# Patient Record
Sex: Male | Born: 1960 | Race: Black or African American | Hispanic: No | Marital: Married | Smoking: Never smoker
Health system: Southern US, Community
[De-identification: ages and names within clinical notes are randomized; demographics above are authoritative.]

## PROBLEM LIST (undated history)

## (undated) DIAGNOSIS — R002 Palpitations: Secondary | ICD-10-CM

## (undated) DIAGNOSIS — T7840XA Allergy, unspecified, initial encounter: Secondary | ICD-10-CM

## (undated) HISTORY — DX: Allergy, unspecified, initial encounter: T78.40XA

## (undated) HISTORY — DX: Palpitations: R00.2

## (undated) HISTORY — PX: WISDOM TOOTH EXTRACTION: SHX21

---

## 1999-09-02 ENCOUNTER — Emergency Department (HOSPITAL_COMMUNITY): Admission: EM | Admit: 1999-09-02 | Discharge: 1999-09-02 | Payer: Self-pay | Admitting: Emergency Medicine

## 1999-09-02 ENCOUNTER — Encounter: Payer: Self-pay | Admitting: Emergency Medicine

## 2007-11-28 HISTORY — PX: HAND SURGERY: SHX662

## 2008-04-24 ENCOUNTER — Emergency Department (HOSPITAL_COMMUNITY): Admission: EM | Admit: 2008-04-24 | Discharge: 2008-04-24 | Payer: Self-pay | Admitting: Family Medicine

## 2008-06-08 ENCOUNTER — Emergency Department (HOSPITAL_COMMUNITY): Admission: EM | Admit: 2008-06-08 | Discharge: 2008-06-08 | Payer: Self-pay | Admitting: Emergency Medicine

## 2009-09-20 ENCOUNTER — Emergency Department (HOSPITAL_COMMUNITY): Admission: EM | Admit: 2009-09-20 | Discharge: 2009-09-20 | Payer: Self-pay | Admitting: Emergency Medicine

## 2012-09-27 ENCOUNTER — Encounter (HOSPITAL_COMMUNITY): Payer: Self-pay

## 2012-09-27 ENCOUNTER — Emergency Department (INDEPENDENT_AMBULATORY_CARE_PROVIDER_SITE_OTHER): Payer: 59

## 2012-09-27 ENCOUNTER — Emergency Department (INDEPENDENT_AMBULATORY_CARE_PROVIDER_SITE_OTHER)
Admission: EM | Admit: 2012-09-27 | Discharge: 2012-09-27 | Disposition: A | Discharge status: Home / Self Care | Payer: 59 | Source: Home / Self Care | Attending: Emergency Medicine | Admitting: Emergency Medicine

## 2012-09-27 DIAGNOSIS — N419 Inflammatory disease of prostate, unspecified: Secondary | ICD-10-CM

## 2012-09-27 DIAGNOSIS — D649 Anemia, unspecified: Secondary | ICD-10-CM

## 2012-09-27 LAB — COMPREHENSIVE METABOLIC PANEL
ALT: 20 U/L (ref 0–53)
AST: 26 U/L (ref 0–37)
Albumin: 4 g/dL (ref 3.5–5.2)
Alkaline Phosphatase: 88 U/L (ref 39–117)
BUN: 17 mg/dL (ref 6–23)
CO2: 26 mEq/L (ref 19–32)
Calcium: 9.2 mg/dL (ref 8.4–10.5)
Chloride: 103 mEq/L (ref 96–112)
Creatinine, Ser: 1.31 mg/dL (ref 0.50–1.35)
GFR calc Af Amer: 72 mL/min — ABNORMAL LOW (ref >90)
GFR calc non Af Amer: 62 mL/min — ABNORMAL LOW (ref >90)
Glucose, Bld: 97 mg/dL (ref 70–99)
Potassium: 3.5 mEq/L (ref 3.5–5.1)
Sodium: 137 mEq/L (ref 135–145)
Total Bilirubin: 0.5 mg/dL (ref 0.3–1.2)
Total Protein: 7.2 g/dL (ref 6.0–8.3)

## 2012-09-27 LAB — CBC WITH DIFFERENTIAL/PLATELET
Basophils Absolute: 0 10*3/uL (ref 0.0–0.1)
Basophils Relative: 0 % (ref 0–1)
Eosinophils Absolute: 0 10*3/uL (ref 0.0–0.7)
Eosinophils Relative: 0 % (ref 0–5)
HCT: 38.6 % — ABNORMAL LOW (ref 39.0–52.0)
Hemoglobin: 12.4 g/dL — ABNORMAL LOW (ref 13.0–17.0)
Lymphocytes Relative: 4 % — ABNORMAL LOW (ref 12–46)
Lymphs Abs: 0.6 10*3/uL — ABNORMAL LOW (ref 0.7–4.0)
MCH: 24.2 pg — ABNORMAL LOW (ref 26.0–34.0)
MCHC: 32.1 g/dL (ref 30.0–36.0)
MCV: 75.4 fL — ABNORMAL LOW (ref 78.0–100.0)
Monocytes Absolute: 1.1 10*3/uL — ABNORMAL HIGH (ref 0.1–1.0)
Monocytes Relative: 9 % (ref 3–12)
Neutro Abs: 11.3 10*3/uL — ABNORMAL HIGH (ref 1.7–7.7)
Neutrophils Relative %: 87 % — ABNORMAL HIGH (ref 43–77)
Platelets: 193 10*3/uL (ref 150–400)
RBC: 5.12 MIL/uL (ref 4.22–5.81)
RDW: 15.7 % — ABNORMAL HIGH (ref 11.5–15.5)
WBC: 13 10*3/uL — ABNORMAL HIGH (ref 4.0–10.5)

## 2012-09-27 LAB — POCT URINALYSIS DIP (DEVICE)
Bilirubin Urine: NEGATIVE
Ketones, ur: NEGATIVE mg/dL
Leukocytes, UA: NEGATIVE
Protein, ur: 30 mg/dL — AB

## 2012-09-27 MED ORDER — ACETAMINOPHEN 325 MG PO TABS
ORAL_TABLET | ORAL | Status: AC
  Filled 2012-09-27: qty 3

## 2012-09-27 MED ORDER — ACETAMINOPHEN 500 MG PO TABS
1000.0000 mg | ORAL_TABLET | Freq: Once | ORAL | Status: AC
  Administered 2012-09-27: 1000 mg via ORAL

## 2012-09-27 MED ORDER — CIPROFLOXACIN HCL 500 MG PO TABS: 500.0000 mg | ORAL_TABLET | Freq: Two times a day (BID) | ORAL | Status: DC

## 2012-09-27 NOTE — ED Notes (Signed)
Fever and chills since 12 noon, no medication for this problem

## 2012-09-27 NOTE — ED Provider Notes (Signed)
Chief Complaint  Patient presents with  . 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 He did not measure his temperature. He denies any headache, nasal congestion, rhinorrhea, sore throat, pain on swallowing, or oral lesions. He has no cervical adenopathy or adenopathy anywhere else. He denies any coughing, wheezing, or shortness of breath. No chest pain. He denies any nausea, vomiting, or diarrhea. He denies any dysuria, urgency, hematuria, or decrease in his stream. He has not had any skin rash or history of a tick bite. He denies any joint pains other than a slight pain in his left elbow which is located over the lateral epicondyle, without any swelling of the elbow joint. He denies any foreign travel or travel to any other parts of the [place]. He's not been exposed to any exotic animals and he denies any drinking of unpasteurized milk or water that had not been treated. 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 He has made a couple of emergency calls on sick people at home, but states these have mostly been cardiac issues and not infectious. He denies any prior history of prostatitis or urinary tract infections.  Review of Systems:  Other than noted above, the patient denies any of the following symptoms. Systemic:  No chills, sweats, fatigue, myalgias, headache, or anorexia. Eye:  No redness, pain or drainage. ENT:  No earache, nasal congestion, rhinorrhea, sinus pressure, or sore throat. No adenopathy or stiff neck. Lungs:  No cough, sputum production, wheezing, shortness of breath.  Cardiovascular:  No chest pain, palpitations, or syncope. GI:  No  nausea, vomiting, abdominal pain or diarrhea. GU:  No dysuria, frequency, or hematuria. Skin:  No rash or pruritis.  PMFSH:  Past medical history, family history, social history, meds, and allergies were reviewed. There is no history of recent foreign travel, animal exposure, suspicious ingestions or tick bite.  No new medications, vaccination, or bites or stings.  Physical Exam:   Vital signs:  BP 112/55  Pulse 81  Temp 100.2 F (37.9 C) (Oral)  Resp 14  SpO2 100% General:  Alert, in no distress. Eye:  PERRL, full EOMs.  Lids and conjunctivas were normal. ENT:  TMs and canals were normal, without erythema or inflammation.  Nasal mucosa was clear and uncongested, without drainage.  Mucous membranes were moist.  Pharynx was clear, without exudate or drainage.  There were no oral ulcerations or lesions. Neck:  Supple, no adenopathy, tenderness or mass. Thyroid was normal. Lungs:  No respiratory distress.  Lungs were clear to auscultation, without wheezes, rales or rhonchi.  Breath sounds were clear and equal bilaterally. Heart:  Regular rhythm, without gallops, murmers or rubs. Abdomen:  Soft, flat, and non-tender to palpation.  No hepatosplenomagaly or mass. Rectal exam: Digital rectal reveals no masses, no tenderness, normal prostate exam, no prostatic tenderness or masses, and heme-negative stool. Extremities:  No swelling, erythema, or joint pain to palpation. Skin:  Clear, warm, and dry, without rash or lesions.  Labs:   Results for orders placed during the hospital encounter of 09/27/12  CBC WITH DIFFERENTIAL      Component Value Range   WBC  13.0 (*) 4.0 - 10.5 K/uL   RBC 5.12  4.22 - 5.81 MIL/uL   Hemoglobin 12.4 (*) 13.0 - 17.0 g/dL   HCT 16.1 (*) 09.6 - 04.5 %   MCV 75.4 (*) 78.0 - 100.0 fL   MCH 24.2 (*) 26.0 - 34.0 pg   MCHC 32.1  30.0 - 36.0 g/dL   RDW 40.9 (*) 81.1 - 91.4 %   Platelets 193  150 - 400 K/uL   Neutrophils Relative 87 (*) 43 - 77 %   Neutro Abs 11.3 (*)  1.7 - 7.7 K/uL   Lymphocytes Relative 4 (*) 12 - 46 %   Lymphs Abs 0.6 (*) 0.7 - 4.0 K/uL   Monocytes Relative 9  3 - 12 %   Monocytes Absolute 1.1 (*) 0.1 - 1.0 K/uL   Eosinophils Relative 0  0 - 5 %   Eosinophils Absolute 0.0  0.0 - 0.7 K/uL   Basophils Relative 0  0 - 1 %   Basophils Absolute 0.0  0.0 - 0.1 K/uL  COMPREHENSIVE METABOLIC PANEL      Component Value Range   Sodium 137  135 - 145 mEq/L   Potassium 3.5  3.5 - 5.1 mEq/L   Chloride 103  96 - 112 mEq/L   CO2 26  19 - 32 mEq/L   Glucose, Bld 97  70 - 99 mg/dL   BUN 17  6 - 23 mg/dL   Creatinine, Ser 7.82  0.50 - 1.35 mg/dL   Calcium 9.2  8.4 - 95.6 mg/dL   Total Protein 7.2  6.0 - 8.3 g/dL   Albumin 4.0  3.5 - 5.2 g/dL   AST 26  0 - 37 U/L   ALT 20  0 - 53 U/L   Alkaline Phosphatase 88  39 - 117 U/L   Total Bilirubin 0.5  0.3 - 1.2 mg/dL   GFR calc non Af Amer 62 (*) >90 mL/min   GFR calc Af Amer 72 (*) >90 mL/min  POCT URINALYSIS DIP (DEVICE)      Component Value Range   Glucose, UA NEGATIVE  NEGATIVE mg/dL   Bilirubin Urine NEGATIVE  NEGATIVE   Ketones, ur NEGATIVE  NEGATIVE mg/dL   Specific Gravity, Urine 1.015  1.005 - 1.030   Hgb urine dipstick NEGATIVE  NEGATIVE   pH 7.0  5.0 - 8.0   Protein, ur 30 (*) NEGATIVE mg/dL   Urobilinogen, UA 0.2  0.0 - 1.0 mg/dL   Nitrite NEGATIVE  NEGATIVE   Leukocytes, UA NEGATIVE  NEGATIVE     Radiology:  Dg Chest 2 View  09/27/2012  *RADIOLOGY REPORT*  Clinical Data: Fever.  CHEST - 2 VIEW  Comparison: None.  Findings: Cardiomediastinal silhouette appears normal.  No acute pulmonary disease is noted.  Bony thorax is intact.  IMPRESSION: No acute cardiopulmonary abnormality seen.   Original Report Authenticated By: Lupita Raider.,  M.D.     Assessment:  The primary encounter diagnosis was Prostatitis. A diagnosis of Anemia was also pertinent to this visit.  The mildly elevated white blood cell count suggests that this may be prostatitis, although on exam his prostate was  not at all tender. He has not had any urinary symptoms with the exception of some urinary frequency. A urine culture has been obtained, he will be treated with Cipro, and I suggested that he followup with his primary care physician, Dr. Melina Copa, early next week. I also suggested that he discuss with Dr. Renato Gails his mild  anemia which may be either iron deficiency anemia or due to thalassemia.  Plan:   1.  The following meds were prescribed:   New Prescriptions   CIPROFLOXACIN (CIPRO) 500 MG TABLET    Take 1 tablet (500 mg total) by mouth every 12 (twelve) hours.   2.  The patient was instructed in symptomatic care and handouts were given. 3.  The patient was told to return if becoming worse in any way, if no better in 3 or 4 days, and given some red flag symptoms that would indicate earlier return.  Follow up:  The patient was told to follow up with Dr. Azucena Kuba early next week.     Reuben Likes, MD 09/27/12 2219

## 2012-09-28 LAB — URINE CULTURE
Culture: NO GROWTH
Special Requests: NORMAL

## 2012-09-30 NOTE — ED Notes (Addendum)
Still waiting for RMSF titer, UA culture negative

## 2014-03-13 ENCOUNTER — Ambulatory Visit (INDEPENDENT_AMBULATORY_CARE_PROVIDER_SITE_OTHER): Payer: 59 | Admitting: Family Medicine

## 2014-03-13 VITALS — BP 138/84 | HR 71 | Temp 99.0°F | Resp 16 | Ht 73.5 in | Wt 186.2 lb

## 2014-03-13 DIAGNOSIS — R21 Rash and other nonspecific skin eruption: Secondary | ICD-10-CM

## 2014-03-13 MED ORDER — BETAMETHASONE DIPROPIONATE 0.05 % EX CREA: TOPICAL_CREAM | Freq: Two times a day (BID) | CUTANEOUS | Status: DC

## 2014-03-13 NOTE — Progress Notes (Signed)
   Subjective:    Patient ID: Todd Rogers, male    DOB: Sep 02, 1961, 53 y.o.   MRN: [id number]  HPI Right shin rash x 2-3 weeks. Started with darkening of skin about the size of a quarter. Has gotten slightly bigger and has become flaky. Never had blister or drainage. Itches. Does not recall pain at site.   Rece[facility] at [facility].  Works outside a lot and camps frequently, concerned about ticks or spiders. Does not recall bite, has not seen any spiders or ticks on skin.  Review of Systems No fever, no chills, no headaches, no myalgias/arthralgias, no nausea/vomiting/abdominal pain.    Objective:   Physical Exam  Vitals reviewed. Constitutional: He is oriented to person, place, and time. He appears well-developed and well-nourished. No distress.  HENT:  Head: Normocephalic and atraumatic.  Eyes: Conjunctivae are normal. Right eye exhibits no discharge. Left eye exhibits no discharge.  Neck: Normal range of motion. Neck supple.  Pulmonary/Chest: Effort normal.  Musculoskeletal: Normal range of motion.  Neurological: He is alert and oriented to person, place, and time.  Skin: Skin is warm and dry. Rash: Right shin with 4-5 cm area darkening, some flaking, no erythema, no drainage. He is not diaphoretic.  Psychiatric: He has a normal mood and affect. His behavior is normal. Judgment and thought content normal.       Assessment & Plan:  1. Rash and nonspecific skin eruption - betamethasone dipropionate (DIPROLENE) 0.05 % cream; Apply topically 2 (two) times daily.  Dispense: 30 g; Refill: 0 -Provided written and verbal instructions regarding medication use, to follow up if worsening or if no improvement with treatment -Also provided information about tick bites and spider bites since patient is outside frequently.  Todd Belfasteborah B. Gessner, FNP-BC  [facility]  03/13/2014 10:06 AM   Patient discussed with  [name], NP and agree with above.    Eula Listenyan Dunn, MHS, PA-C [facility] [address] [place], [place] [address] [phone] 03/13/2014 2:06 PM

## 2014-03-13 NOTE — Patient Instructions (Signed)
Use prescription cream twice a day for 10 days Return if no improvement or worsening.  Provided the following information because patient spends a lot of time outdoors, doing yard work and camping:  Merchandiser, retailTick Bite Information Ticks are insects that attach themselves to the skin and draw blood for food. There are various types of ticks. Common types include wood ticks and deer ticks. Most ticks live in shrubs and grassy areas. Ticks can climb onto your body when you make contact with leaves or grass where the tick is waiting. The most common places on the body for ticks to attach themselves are the scalp, neck, armpits, waist, and groin. Most tick bites are harmless, but sometimes ticks carry germs that cause diseases. These germs can be spread to a person during the tick's feeding process. The chance of a disease spreading through a tick bite depends on:   The type of tick.  Time of year.   How long the tick is attached.   Geographic location.  HOW CAN YOU PREVENT TICK BITES? Take these steps to help prevent tick bites when you are outdoors:  Wear protective clothing. Long sleeves and long pants are best.   Wear white clothes so you can see ticks more easily.  Tuck your pant legs into your socks.   If walking on a trail, stay in the middle of the trail to avoid brushing against bushes.  Avoid walking through areas with long grass.  Put insect repellent on all exposed skin and along boot tops, pant legs, and sleeve cuffs.   Check clothing, hair, and skin repeatedly and before going inside.   Brush off any ticks that are not attached.  Take a shower or bath as soon as possible after being outdoors.  WHAT IS THE PROPER WAY TO REMOVE A TICK? Ticks should be removed as soon as possible to help prevent diseases caused by tick bites. 1. If latex gloves are available, put them on before trying to remove a tick.  2. Using fine-point tweezers, grasp the tick as close to the skin as  possible. You may also use curved forceps or a tick removal tool. Grasp the tick as close to its head as possible. Avoid grasping the tick on its body. 3. Pull gently with steady upward pressure until the tick lets go. Do not twist the tick or jerk it suddenly. This may break off the tick's head or mouth parts. 4. Do not squeeze or crush the tick's body. This could force disease-carrying fluids from the tick into your body.  5. After the tick is removed, wash the bite area and your hands with soap and water or other disinfectant such as alcohol. 6. Apply a small amount of antiseptic cream or ointment to the bite site.  7. Wash and disinfect any instruments that were used.  Do not try to remove a tick by applying a hot match, petroleum jelly, or fingernail polish to the tick. These methods do not work and may increase the chances of disease being spread from the tick bite.  WHEN SHOULD YOU SEEK MEDICAL CARE? Contact your health care provider if you are unable to remove a tick from your skin or if a part of the tick breaks off and is stuck in the skin.  After a tick bite, you need to be aware of signs and symptoms that could be related to diseases spread by ticks. Contact your health care provider if you develop any of the following in the days  or weeks after the tick bite:  Unexplained fever.  Rash. A circular rash that appears days or weeks after the tick bite may indicate the possibility of Lyme disease. The rash may resemble a target with a bull's-eye and may occur at a different part of your body than the tick bite.  Redness and swelling in the area of the tick bite.   Tender, swollen lymph glands.   Diarrhea.   Weight loss.   Cough.   Fatigue.   Muscle, joint, or bone pain.   Abdominal pain.   Headache.   Lethargy or a change in your level of consciousness.  Difficulty walking or moving your legs.   Numbness in the legs.   Paralysis.  Shortness of breath.    Confusion.   Repeated vomiting.  Document Released: 11/10/2000 Document Revised: 09/03/2013 Document Reviewed: 04/23/2013 [facility] Patient Information 2014 [place].

## 2015-07-17 ENCOUNTER — Emergency Department (HOSPITAL_COMMUNITY)
Admission: EM | Admit: 2015-07-17 | Discharge: 2015-07-17 | Disposition: A | Discharge status: Home / Self Care | Payer: Worker's Compensation | Attending: Internal Medicine | Admitting: Internal Medicine

## 2015-07-17 ENCOUNTER — Encounter (HOSPITAL_COMMUNITY): Payer: Self-pay | Admitting: *Deleted

## 2015-07-17 DIAGNOSIS — Y9389 Activity, other specified: Secondary | ICD-10-CM | POA: Insufficient documentation

## 2015-07-17 DIAGNOSIS — Y9289 Other specified places as the place of occurrence of the external cause: Secondary | ICD-10-CM | POA: Diagnosis not present

## 2015-07-17 DIAGNOSIS — Y998 Other external cause status: Secondary | ICD-10-CM | POA: Diagnosis not present

## 2015-07-17 DIAGNOSIS — Z79899 Other long term (current) drug therapy: Secondary | ICD-10-CM | POA: Diagnosis not present

## 2015-07-17 DIAGNOSIS — W07XXXA Fall from chair, initial encounter: Secondary | ICD-10-CM | POA: Insufficient documentation

## 2015-07-17 DIAGNOSIS — S199XXA Unspecified injury of neck, initial encounter: Secondary | ICD-10-CM | POA: Diagnosis not present

## 2015-07-17 DIAGNOSIS — S43401A Unspecified sprain of right shoulder joint, initial encounter: Secondary | ICD-10-CM | POA: Diagnosis not present

## 2015-07-17 DIAGNOSIS — S4991XA Unspecified injury of right shoulder and upper arm, initial encounter: Secondary | ICD-10-CM | POA: Diagnosis present

## 2015-07-17 MED ORDER — DICLOFENAC POTASSIUM 50 MG PO TABS: 50.0000 mg | ORAL_TABLET | Freq: Three times a day (TID) | ORAL | Status: DC

## 2015-07-17 NOTE — ED Notes (Signed)
Pt states he was trying to stop a stair chair from falling and hyperextended his R shoulder.  Now c/o R shoulder pain and R thumb numbness and tinging.

## 2015-07-17 NOTE — ED Provider Notes (Signed)
CSN: [id number]     Arrival date & time 07/17/15  1704 History   First MD Initiated Contact with Patient 07/17/15 1717     No chief complaint on file.    (Consider location/radiation/quality/duration/timing/severity/associated sxs/prior Treatment) HPI Comments: 54 year old male states that he reached out suddenly with his outstretched right arm and experienced acute pain to the right shoulder. He did not fall or land onto his right shoulder. He is complaining of pain primarily to the "top of the shoulder, a little in the back of the shoulder and the back of the upper arm. Denies other injury. Patient is a 54 y.o. male presenting with shoulder pain.  Shoulder Pain Location:  Shoulder Time since incident:  8 hours Injury: yes   Shoulder location:  R shoulder Pain details:    Quality:  Aching, dull and throbbing   Radiates to:  R arm   Severity:  Moderate   Onset quality:  Sudden   Duration:  8 hours   Timing:  Constant   Progression:  Unchanged Chronicity:  New Handedness:  Right-handed Dislocation: no   Foreign body present:  No foreign bodies Prior injury to area:  Yes Relieved by:  Nothing Worsened by:  Movement Ineffective treatments:  None tried Associated symptoms: decreased range of motion, neck pain and numbness   Associated symptoms: no back pain, no fatigue, no fever and no swelling   Risk factors: no known bone disorder     Past Medical History  Diagnosis Date  . Allergy    History reviewed. No pertinent past surgical history. Family History  Problem Relation Age of Onset  . Hypertension Mother   . Diabetes Father   . Cancer Daughter     breast   Social History  Substance Use Topics  . Smoking status: Never Smoker   . Smokeless tobacco: None  . Alcohol Use: No    Review of Systems  Constitutional: Positive for activity change. Negative for fever and fatigue.  HENT: Negative.   Respiratory: Negative.   Gastrointestinal: Negative.   Musculoskeletal:  Positive for neck pain. Negative for myalgias, back pain and joint swelling.  Skin: Negative.   Neurological: Positive for numbness.      Allergies  Review of patient's allergies indicates no known allergies.  Home Medications   Prior to Admission medications   Medication Sig Start Date End Date Taking? Authorizing Provider  betamethasone dipropionate (DIPROLENE) 0.05 % cream Apply topically 2 (two) times daily. 03/13/14   Emi Belfast, FNP  diclofenac (CATAFLAM) 50 MG tablet Take 1 tablet (50 mg total) by mouth 3 (three) times daily. One tablet TID with food prn pain. 07/17/15   Hayden Rasmussen, NP  fluticasone (FLONASE) 50 MCG/ACT nasal spray Place into both nostrils as needed for allergies or rhinitis.    Historical Provider, MD  loratadine (CLARITIN) 10 MG tablet Take 10 mg by mouth as needed for allergies.    Historical Provider, MD   BP 148/108 mmHg  Pulse 74  Temp(Src) 98.1 F (36.7 C) (Oral)  Resp 18  Ht 6\' 2"  (1.88 m)  Wt 187 lb (84.823 kg)  BMI 24.00 kg/m2  SpO2 99% Physical Exam  Constitutional: He is oriented to person, place, and time. He appears well-developed and well-nourished. No distress.  Eyes: EOM are normal.  Neck: Normal range of motion. Neck supple.  Cardiovascular: Normal rate.   Pulmonary/Chest: Effort normal. No respiratory distress.  Musculoskeletal: He exhibits tenderness. He exhibits no edema.  No asymmetry to the  right shoulder. No apparent swelling, deformity, discoloration. Abduction is complete over his head but this does call some discomfort to the posterior shoulder. Placing the right arm behind his back producing internal rotation produces pain in the same area. He is able to place his right arm across his chest with minimal discomfort. Areas of pain with palpation include the posterior deltoid, small area of the supraspinatus and a single-point to the anterior shoulder joint. Muscle strength is normal. Distal neurovascular intact. He does complain  of some numbness in the fingers.  Lymphadenopathy:    He has no cervical adenopathy.  Neurological: He is alert and oriented to person, place, and time. He exhibits normal muscle tone.  Skin: Skin is warm and dry.  Nursing note and vitals reviewed.   ED Course  Procedures (including critical care time) Labs Review Labs Reviewed - No data to display  Imaging Review No results found. I have personally reviewed and evaluated these images and lab results as part of my medical decision-making.   EKG Interpretation None      MDM   Final diagnoses:  Shoulder sprain, right, initial encounter   No signs of impingement syndrome. Doubt rotator cuff tear although portions of his area pain may involve a nonspecific  area of the rotator cuff. Wear sling for 2-3 days. After the first day or 2 remove the sling and slowly moved the right shoulder around and perform maneuvers as demonstrated. He may wear the sling often known for 3-4 days but not constantly after 2 days. Apply ice to the area of the shoulder with pain for the first 2-3 days then you may go to heat. Cataflam for pain as directed. Take with food.     Hayden Rasmussen, NP 07/17/15 1746  Hayden Rasmussen, NP 07/17/15 1750

## 2015-07-17 NOTE — Discharge Instructions (Signed)
Shoulder Sprain Wear sling for 2-3 days. After the first day or 2 remove the sling and slowly moved the right shoulder around and perform maneuvers as demonstrated. He may wear the sling often known for 3-4 days but not constantly after 2 days. Apply ice to the area of the shoulder with pain for the first 2-3 days then you may go to heat. [place] for pain as directed. Take with food. A shoulder sprain is the result of damage to the tough, fiber-like tissues (ligaments) that help hold your shoulder in place. The ligaments may be stretched or torn. Besides the main shoulder joint (the ball and socket), there are several smaller joints that connect the bones in this area. A sprain usually involves one of those joints. Most often it is the acromioclavicular (or AC) joint. That is the joint that connects the collarbone (clavicle) and the shoulder blade (scapula) at the top point of the shoulder blade (acromion). A shoulder sprain is a mild form of what is called a shoulder separation. Recovering from a shoulder sprain may take some time. For some, pain lingers for several months. Most people recover without long term problems. CAUSES   A shoulder sprain is usually caused by some kind of trauma. This might be:  Falling on an outstretched arm.  Being hit hard on the shoulder.  Twisting the arm.  Shoulder sprains are more likely to occur in people who:  Play sports.  Have balance or coordination problems. SYMPTOMS   Pain when you move your shoulder.  Limited ability to move the shoulder.  Swelling and tenderness on top of the shoulder.  Redness or warmth in the shoulder.  Bruising.  A change in the shape of the shoulder. DIAGNOSIS  Your healthcare provider may:  Ask about your symptoms.  Ask about recent activity that might have caused those symptoms.  Examine your shoulder. You may be asked to do simple exercises to test movement. The other shoulder will be examined for  comparison.  Order some tests that provide a look inside the body. They can show the extent of the injury. The tests could include:  X-rays.  CT (computed tomography) scan.  MRI (magnetic resonance imaging) scan. RISKS AND COMPLICATIONS  Loss of full shoulder motion.  Ongoing shoulder pain. TREATMENT  How long it takes to recover from a shoulder sprain depends on how severe it was. Treatment options may include:  Rest. You should not use the arm or shoulder until it heals.  Ice. For 2 or 3 days after the injury, put an ice pack on the shoulder up to 4 times a day. It should stay on for 15 to 20 minutes each time. Wrap the ice in a towel so it does not touch your skin.  Over-the-counter medicine to relieve pain.  A sling or brace. This will keep the arm still while the shoulder is healing.  Physical therapy or rehabilitation exercises. These will help you regain strength and motion. Ask your healthcare provider when it is OK to begin these exercises.  Surgery. The need for surgery is rare with a sprained shoulder, but some people may need surgery to keep the joint in place and reduce pain. HOME CARE INSTRUCTIONS   Ask your healthcare provider about what you should and should not do while your shoulder heals.  Make sure you know how to apply ice to the correct area of your shoulder.  Talk with your healthcare provider about which medications should be used for pain and  swelling.  If rehabilitation therapy will be needed, ask your healthcare provider to refer you to a therapist. If it is not recommended, then ask about at-home exercises. Find out when exercise should begin. SEEK MEDICAL CARE IF:  Your pain, swelling, or redness at the joint increases. SEEK IMMEDIATE MEDICAL CARE IF:   You have a fever.  You cannot move your arm or shoulder. Document Released: 04/01/2009 Document Revised: 02/05/2012 Document Reviewed: 04/01/2009 [facility] Patient Information 2015 [place],  [place]. This information is not intended to replace advice given to you by your health care provider. Make sure you discuss any questions you have with your health care provider.  Muscle Strain A muscle strain is an injury that occurs when a muscle is stretched beyond its normal length. Usually a small number of muscle fibers are torn when this happens. Muscle strain is rated in degrees. First-degree strains have the least amount of muscle fiber tearing and pain. Second-degree and third-degree strains have increasingly more tearing and pain.  Usually, recovery from muscle strain takes 1-2 weeks. Complete healing takes 5-6 weeks.  CAUSES  Muscle strain happens when a sudden, violent force placed on a muscle stretches it too far. This may occur with lifting, sports, or a fall.  RISK FACTORS Muscle strain is especially common in athletes.  SIGNS AND SYMPTOMS At the site of the muscle strain, there may be:  Pain.  Bruising.  Swelling.  Difficulty using the muscle due to pain or lack of normal function. DIAGNOSIS  Your health care provider will perform a physical exam and ask about your medical history. TREATMENT  Often, the best treatment for a muscle strain is resting, icing, and applying cold compresses to the injured area.  HOME CARE INSTRUCTIONS   Use the PRICE method of treatment to promote muscle healing during the first 2-3 days after your injury. The PRICE method involves:  Protecting the muscle from being injured again.  Restricting your activity and resting the injured body part.  Icing your injury. To do this, put ice in a plastic bag. Place a towel between your skin and the bag. Then, apply the ice and leave it on from 15-20 minutes each hour. After the third day, switch to moist heat packs.  Apply compression to the injured area with a splint or elastic bandage. Be careful not to wrap it too tightly. This may interfere with blood circulation or increase swelling.  Elevate  the injured body part above the level of your heart as often as you can.  Only take over-the-counter or prescription medicines for pain, discomfort, or fever as directed by your health care provider.  Warming up prior to exercise helps to prevent future muscle strains. SEEK MEDICAL CARE IF:   You have increasing pain or swelling in the injured area.  You have numbness, tingling, or a significant loss of strength in the injured area. MAKE SURE YOU:   Understand these instructions.  Will watch your condition.  Will get help right away if you are not doing well or get worse. Document Released: 11/13/2005 Document Revised: 09/03/2013 Document Reviewed: 06/12/2013 [facility] Patient Information 2015 [place], [place]. This information is not intended to replace advice given to you by your health care provider. Make sure you discuss any questions you have with your health care provider.

## 2015-07-19 ENCOUNTER — Other Ambulatory Visit: Payer: Self-pay | Admitting: Nurse Practitioner

## 2015-07-19 ENCOUNTER — Ambulatory Visit
Admission: RE | Admit: 2015-07-19 | Discharge: 2015-07-19 | Disposition: A | Discharge status: Home / Self Care | Payer: Worker's Compensation | Source: Ambulatory Visit | Attending: Nurse Practitioner | Admitting: Nurse Practitioner

## 2015-07-19 DIAGNOSIS — T1490XA Injury, unspecified, initial encounter: Secondary | ICD-10-CM

## 2015-07-19 DIAGNOSIS — R52 Pain, unspecified: Secondary | ICD-10-CM

## 2015-07-23 ENCOUNTER — Encounter (HOSPITAL_COMMUNITY): Payer: Self-pay | Admitting: Emergency Medicine

## 2015-07-23 ENCOUNTER — Emergency Department (HOSPITAL_COMMUNITY)
Admission: EM | Admit: 2015-07-23 | Discharge: 2015-07-23 | Disposition: A | Discharge status: Home / Self Care | Payer: Worker's Compensation | Attending: Emergency Medicine | Admitting: Emergency Medicine

## 2015-07-23 DIAGNOSIS — X58XXXD Exposure to other specified factors, subsequent encounter: Secondary | ICD-10-CM | POA: Insufficient documentation

## 2015-07-23 DIAGNOSIS — S4991XD Unspecified injury of right shoulder and upper arm, subsequent encounter: Secondary | ICD-10-CM | POA: Diagnosis present

## 2015-07-23 DIAGNOSIS — Z79899 Other long term (current) drug therapy: Secondary | ICD-10-CM | POA: Diagnosis not present

## 2015-07-23 DIAGNOSIS — S46011D Strain of muscle(s) and tendon(s) of the rotator cuff of right shoulder, subsequent encounter: Secondary | ICD-10-CM | POA: Insufficient documentation

## 2015-07-23 LAB — CBG MONITORING, ED: Glucose-Capillary: 85 mg/dL (ref 65–99)

## 2015-07-23 MED ORDER — OXYCODONE-ACETAMINOPHEN 5-325 MG PO TABS
1.0000 | ORAL_TABLET | Freq: Once | ORAL | Status: AC
  Administered 2015-07-23: 1 via ORAL
  Filled 2015-07-23: qty 1

## 2015-07-23 MED ORDER — OXYCODONE-ACETAMINOPHEN 5-325 MG PO TABS: 1.0000 | ORAL_TABLET | ORAL | Status: DC | PRN

## 2015-07-23 MED ORDER — DIAZEPAM 5 MG PO TABS: 5.0000 mg | ORAL_TABLET | Freq: Two times a day (BID) | ORAL | Status: DC | PRN

## 2015-07-23 MED ORDER — NAPROXEN 500 MG PO TABS: 500.0000 mg | ORAL_TABLET | Freq: Two times a day (BID) | ORAL | Status: DC

## 2015-07-23 NOTE — Discharge Instructions (Signed)
Rotator Cuff Injury °Rotator cuff injury is any type of injury to the set of muscles and tendons that make up the stabilizing unit of your shoulder. This unit holds the ball of your upper arm bone (humerus) in the socket of your shoulder blade (scapula).  °CAUSES °Injuries to your rotator cuff most commonly come from sports or activities that cause your arm to be moved repeatedly over your head. Examples of this include throwing, weight lifting, swimming, or racquet sports. Long lasting (chronic) irritation of your rotator cuff can cause soreness and swelling (inflammation), bursitis, and eventual damage to your tendons, such as a tear (rupture). °SIGNS AND SYMPTOMS °Acute rotator cuff tear: °· Sudden tearing sensation followed by severe pain shooting from your upper shoulder down your arm toward your elbow. °· Decreased range of motion of your shoulder because of pain and muscle spasm. °· Severe pain. °· Inability to raise your arm out to the side because of pain and loss of muscle power (large tears). °Chronic rotator cuff tear: °· Pain that usually is worse at night and may interfere with sleep. °· Gradual weakness and decreased shoulder motion as the pain worsens. °· Decreased range of motion. °Rotator cuff tendinitis:  °· Deep ache in your shoulder and the outside upper arm over your shoulder. °· Pain that comes on gradually and becomes worse when lifting your arm to the side or turning it inward. °DIAGNOSIS °Rotator cuff injury is diagnosed through a medical history, physical exam, and imaging exam. The medical history helps determine the type of rotator cuff injury. Your health care provider will look at your injured shoulder, feel the injured area, and ask you to move your shoulder in different positions. X-ray exams typically are done to rule out other causes of shoulder pain, such as fractures. MRI is the exam of choice for the most severe shoulder injuries because the images show muscles and tendons.    °TREATMENT  °Chronic tear: °· Medicine for pain, such as acetaminophen or ibuprofen. °· Physical therapy and range-of-motion exercises may be helpful in maintaining shoulder function and strength. °· Steroid injections into your shoulder joint. °· Surgical repair of the rotator cuff if the injury does not heal with noninvasive treatment. °Acute tear: °· Anti-inflammatory medicines such as ibuprofen and naproxen to help reduce pain and swelling. °· A sling to help support your arm and rest your rotator cuff muscles. Long-term use of a sling is not advised. It may cause significant stiffening of the shoulder joint. °· Surgery may be considered within a few weeks, especially in younger, active people, to return the shoulder to full function. °· Indications for surgical treatment include the following: °¨ Age younger than 60 years. °¨ Rotator cuff tears that are complete. °¨ Physical therapy, rest, and anti-inflammatory medicines have been used for 6-8 weeks, with no improvement. °¨ Employment or sporting activity that requires constant shoulder use. °Tendinitis: °· Anti-inflammatory medicines such as ibuprofen and naproxen to help reduce pain and swelling. °· A sling to help support your arm and rest your rotator cuff muscles. Long-term use of a sling is not advised. It may cause significant stiffening of the shoulder joint. °· Severe tendinitis may require: °¨ Steroid injections into your shoulder joint. °¨ Physical therapy. °¨ Surgery. °HOME CARE INSTRUCTIONS  °· Apply ice to your injury: °¨ Put ice in a plastic bag. °¨ Place a towel between your skin and the bag. °¨ Leave the ice on for 20 minutes, 2-3 times a day. °· If you   have a shoulder immobilizer (sling and straps), wear it until told otherwise by your health care provider.  You may want to sleep on several pillows or in a recliner at night to lessen swelling and pain.  Only take over-the-counter or prescription medicines for pain, discomfort, or fever as  directed by your health care provider.  Do simple hand squeezing exercises with a soft rubber ball to decrease hand swelling. SEEK MEDICAL CARE IF:   Your shoulder pain increases, or new pain or numbness develops in your arm, hand, or fingers.  Your hand or fingers are colder than your other hand. SEEK IMMEDIATE MEDICAL CARE IF:   Your arm, hand, or fingers are numb or tingling.  Your arm, hand, or fingers are increasingly swollen and painful, or they turn white or blue. MAKE SURE YOU:  Understand these instructions.  Will watch your condition.  Will get help right away if you are not doing well or get worse. Document Released: 11/10/2000 Document Revised: 11/18/2013 Document Reviewed: 06/25/2013 [facility] Patient Information 2015 [place], [place]. This information is not intended to replace advice given to you by your health care provider. Make sure you discuss any questions you have with your health care provider.  Naproxen and naproxen sodium oral immediate-release tablets What is this medicine? NAPROXEN (na PROX en) is a non-steroidal anti-inflammatory drug (NSAID). It is used to reduce swelling and to treat pain. This medicine may be used for dental pain, headache, or painful monthly periods. It is also used for painful joint and muscular problems such as arthritis, tendinitis, bursitis, and gout. This medicine may be used for other purposes; ask your health care provider or pharmacist if you have questions. COMMON BRAND NAME(S): Aflaxen, Aleve, Aleve Arthritis, All Day Relief, Anaprox, Anaprox DS, Naprosyn What should I tell my health care provider before I take this medicine? They need to know if you have any of these conditions: -asthma -cigarette smoker -drink more than 3 alcohol containing drinks a day -heart disease or circulation problems such as heart failure or leg edema (fluid retention) -high blood pressure -kidney disease -liver disease -stomach bleeding or  ulcers -an unusual or allergic reaction to naproxen, aspirin, other NSAIDs, other medicines, foods, dyes, or preservatives -pregnant or trying to get pregnant -breast-feeding How should I use this medicine? Take this medicine by mouth with a glass of water. Follow the directions on the prescription label. Take it with food if your stomach gets upset. Try to not lie down for at least 10 minutes after you take it. Take your medicine at regular intervals. Do not take your medicine more often than directed. Long-term, continuous use may increase the risk of heart attack or stroke. A special MedGuide will be given to you by the pharmacist with each prescription and refill. Be sure to read this information carefully each time. Talk to your pediatrician regarding the use of this medicine in children. Special care may be needed. Overdosage: If you think you have taken too much of this medicine contact a poison control center or emergency room at once. NOTE: This medicine is only for you. Do not share this medicine with others. What if I miss a dose? If you miss a dose, take it as soon as you can. If it is almost time for your next dose, take only that dose. Do not take double or extra doses. What may interact with this medicine? -alcohol -aspirin -cidofovir -diuretics -lithium -methotrexate -other drugs for inflammation like ketorolac or prednisone -pemetrexed -probenecid -  warfarin This list may not describe all possible interactions. Give your health care provider a list of all the medicines, herbs, non-prescription drugs, or dietary supplements you use. Also tell them if you smoke, drink alcohol, or use illegal drugs. Some items may interact with your medicine. What should I watch for while using this medicine? Tell your doctor or health care professional if your pain does not get better. Talk to your doctor before taking another medicine for pain. Do not treat yourself. This medicine does not  prevent heart attack or stroke. In fact, this medicine may increase the chance of a heart attack or stroke. The chance may increase with longer use of this medicine and in people who have heart disease. If you take aspirin to prevent heart attack or stroke, talk with your doctor or health care professional. Do not take other medicines that contain aspirin, ibuprofen, or naproxen with this medicine. Side effects such as stomach upset, nausea, or ulcers may be more likely to occur. Many medicines available without a prescription should not be taken with this medicine. This medicine can cause ulcers and bleeding in the stomach and intestines at any time during treatment. Do not smoke cigarettes or drink alcohol. These increase irritation to your stomach and can make it more susceptible to damage from this medicine. Ulcers and bleeding can happen without warning symptoms and can cause death. You may get drowsy or dizzy. Do not drive, use machinery, or do anything that needs mental alertness until you know how this medicine affects you. Do not stand or sit up quickly, especially if you are an older patient. This reduces the risk of dizzy or fainting spells. This medicine can cause you to bleed more easily. Try to avoid damage to your teeth and gums when you brush or floss your teeth. What side effects may I notice from receiving this medicine? Side effects that you should report to your doctor or health care professional as soon as possible: -black or bloody stools, blood in the urine or vomit -blurred vision -chest pain -difficulty breathing or wheezing -nausea or vomiting -severe stomach pain -skin rash, skin redness, blistering or peeling skin, hives, or itching -slurred speech or weakness on one side of the body -swelling of eyelids, throat, lips -unexplained weight gain or swelling -unusually weak or tired -yellowing of eyes or skin Side effects that usually do not require medical attention  (report to your doctor or health care professional if they continue or are bothersome): -constipation -headache -heartburn This list may not describe all possible side effects. Call your doctor for medical advice about side effects. You may report side effects to FDA at 1-800-FDA-1088. Where should I keep my medicine? Keep out of the reach of children. Store at room temperature between 15 and 30 degrees C (59 and 86 degrees F). Keep container tightly closed. Throw away any unused medicine after the expiration date. NOTE: This sheet is a summary. It may not cover all possible information. If you have questions about this medicine, talk to your doctor, pharmacist, or health care provider.  2015, Elsevier/[employer] Standard. (2009-11-15 20:10:16)  Diazepam tablets What is this medicine? DIAZEPAM (dye AZ e pam) is a benzodiazepine. It is used to treat anxiety and nervousness. It also can help treat alcohol withdrawal, relax muscles, and treat certain types of seizures. This medicine may be used for other purposes; ask your health care provider or pharmacist if you have questions. COMMON BRAND NAME(S): Valium What should I tell my health  care provider before I take this medicine? They need to know if you have any of these conditions -an alcohol or drug abuse problem -bipolar disorder, depression, psychosis or other mental health condition -glaucoma -kidney or liver disease -lung or breathing disease -myasthenia gravis -Parkinson's disease -seizures or a history of seizures -suicidal thoughts -an unusual or allergic reaction to diazepam, other benzodiazepines, foods, dyes, or preservatives -pregnant or trying to get pregnant -breast-feeding How should I use this medicine? Take this medicine by mouth with a glass of water. Follow the directions on the prescription label. If this medicine upsets your stomach, take it with food or milk. Take your doses at regular intervals. Do not take your  medicine more often than directed. If you have been taking this medicine regularly for some time, do not suddenly stop taking it. You must gradually reduce the dose or you may get severe side effects. Ask your doctor or health care professional for advice. Even after you stop taking this medicine it can still affect your body for several days. Talk to your pediatrician regarding the use of this medicine in children. Special care may be needed. Overdosage: If you think you have taken too much of this medicine contact a poison control center or emergency room at once. NOTE: This medicine is only for you. Do not share this medicine with others. What if I miss a dose? If you miss a dose, take it as soon as you can. If it is almost time for your next dose, take only that dose. Do not take double or extra doses. What may interact with this medicine? -cimetidine -grapefruit juice -herbal or dietary supplements like kava kava, melatonin, St. John's Wort, or valerian -medicines for anxiety or sleeping problems, like alprazolam, lorazepam, or triazolam -medicines for depression, mental problems or psychiatric disturbances -medicines for HIV infection or AIDS -prescription pain medicines -rifampin, rifapentine, or rifabutin -some medicines for seizures like carbamazepine, phenobarbital, phenytoin, or primidone This list may not describe all possible interactions. Give your health care provider a list of all the medicines, herbs, non-prescription drugs, or dietary supplements you use. Also tell them if you smoke, drink alcohol, or use illegal drugs. Some items may interact with your medicine. What should I watch for while using this medicine? Visit your doctor or health care professional for regular checks on your progress. Your body can become dependent on this medicine. Ask your doctor or health care professional if you still need to take it. You may get drowsy or dizzy. Do not drive, use machinery, or do  anything that needs mental alertness until you know how this medicine affects you. To reduce the risk of dizzy and fainting spells, do not stand or sit up quickly, especially if you are an older patient. Alcohol may increase dizziness and drowsiness. Avoid alcoholic drinks. Do not treat yourself for coughs, colds or allergies without asking your doctor or health care professional for advice. Some ingredients can increase possible side effects. What side effects may I notice from receiving this medicine? Side effects that you should report to your doctor or health care professional as soon as possible: -allergic reactions like skin rash, itching or hives, swelling of the face, lips, or tongue -angry, confused, depressed, other mood changes -breathing problems -feeling faint or lightheaded, falls -muscle cramps -problems with balance, talking, walking -restlessness -tremors -trouble passing urine or change in the amount of urine -unusually weak or tired Side effects that usually do not require medical attention (report to your  doctor or health care professional if they continue or are bothersome): -difficulty sleeping, nightmares -dizziness, drowsiness, clumsiness, or unsteadiness, a hangover effect -headache -nausea, vomiting This list may not describe all possible side effects. Call your doctor for medical advice about side effects. You may report side effects to FDA at 1-800-FDA-1088. Where should I keep my medicine? Keep out of the reach of children. This medicine can be abused. Keep your medicine in a safe place to protect it from theft. Do not share this medicine with anyone. Selling or giving away this medicine is dangerous and against the law. Store at room temperature between 15 and 30 degrees C (59 and 86 degrees F). Protect from light. Keep container tightly closed. Throw away any unused medicine after the expiration date. NOTE: This sheet is a summary. It may not cover all possible  information. If you have questions about this medicine, talk to your doctor, pharmacist, or health care provider.  2015, [employer]vier/Gold Standard. (2008-03-02 16:57:35)  Acetaminophen; Oxycodone tablets What is this medicine? ACETAMINOPHEN; OXYCODONE (a set a MEE noe fen; ox i KOE done) is a pain reliever. It is used to treat mild to moderate pain. This medicine may be used for other purposes; ask your health care provider or pharmacist if you have questions. COMMON BRAND NAME(S): Endocet, Magnacet, Narvox, Percocet, Perloxx, Primalev, Primlev, Roxicet, Xolox What should I tell my health care provider before I take this medicine? They need to know if you have any of these conditions: -brain tumor -Crohn's disease, inflammatory bowel disease, or ulcerative colitis -drug abuse or addiction -head injury -heart or circulation problems -if you often drink alcohol -kidney disease or problems going to the bathroom -liver disease -lung disease, asthma, or breathing problems -an unusual or allergic reaction to acetaminophen, oxycodone, other opioid analgesics, other medicines, foods, dyes, or preservatives -pregnant or trying to get pregnant -breast-feeding How should I use this medicine? Take this medicine by mouth with a full glass of water. Follow the directions on the prescription label. Take your medicine at regular intervals. Do not take your medicine more often than directed. Talk to your pediatrician regarding the use of this medicine in children. Special care may be needed. Patients over 67 years old may have a stronger reaction and need a smaller dose. Overdosage: If you think you have taken too much of this medicine contact a poison control center or emergency room at once. NOTE: This medicine is only for you. Do not share this medicine with others. What if I miss a dose? If you miss a dose, take it as soon as you can. If it is almost time for your next dose, take only that dose. Do not  take double or extra doses. What may interact with this medicine? -alcohol -antihistamines -barbiturates like amobarbital, butalbital, butabarbital, methohexital, pentobarbital, phenobarbital, thiopental, and secobarbital -benztropine -drugs for bladder problems like solifenacin, trospium, oxybutynin, tolterodine, hyoscyamine, and methscopolamine -drugs for breathing problems like ipratropium and tiotropium -drugs for certain stomach or intestine problems like propantheline, homatropine methylbromide, glycopyrrolate, atropine, belladonna, and dicyclomine -general anesthetics like etomidate, ketamine, nitrous oxide, propofol, desflurane, enflurane, halothane, isoflurane, and sevoflurane -medicines for depression, anxiety, or psychotic disturbances -medicines for sleep -muscle relaxants -naltrexone -narcotic medicines (opiates) for pain -phenothiazines like perphenazine, thioridazine, chlorpromazine, mesoridazine, fluphenazine, prochlorperazine, promazine, and trifluoperazine -scopolamine -tramadol -trihexyphenidyl This list may not describe all possible interactions. Give your health care provider a list of all the medicines, herbs, non-prescription drugs, or dietary supplements you use. Also tell them if  you smoke, drink alcohol, or use illegal drugs. Some items may interact with your medicine. What should I watch for while using this medicine? Tell your doctor or health care professional if your pain does not go away, if it gets worse, or if you have new or a different type of pain. You may develop tolerance to the medicine. Tolerance means that you will need a higher dose of the medication for pain relief. Tolerance is normal and is expected if you take this medicine for a long time. Do not suddenly stop taking your medicine because you may develop a severe reaction. Your body becomes used to the medicine. This does NOT mean you are addicted. Addiction is a behavior related to getting and  using a drug for a non-medical reason. If you have pain, you have a medical reason to take pain medicine. Your doctor will tell you how much medicine to take. If your doctor wants you to stop the medicine, the dose will be slowly lowered over time to avoid any side effects. You may get drowsy or dizzy. Do not drive, use machinery, or do anything that needs mental alertness until you know how this medicine affects you. Do not stand or sit up quickly, especially if you are an older patient. This reduces the risk of dizzy or fainting spells. Alcohol may interfere with the effect of this medicine. Avoid alcoholic drinks. There are different types of narcotic medicines (opiates) for pain. If you take more than one type at the same time, you may have more side effects. Give your health care provider a list of all medicines you use. Your doctor will tell you how much medicine to take. Do not take more medicine than directed. Call emergency for help if you have problems breathing. The medicine will cause constipation. Try to have a bowel movement at least every 2 to 3 days. If you do not have a bowel movement for 3 days, call your doctor or health care professional. Do not take Tylenol (acetaminophen) or medicines that have acetaminophen with this medicine. Too much acetaminophen can be very dangerous. Many nonprescription medicines contain acetaminophen. Always read the labels carefully to avoid taking more acetaminophen. What side effects may I notice from receiving this medicine? Side effects that you should report to your doctor or health care professional as soon as possible: -allergic reactions like skin rash, itching or hives, swelling of the face, lips, or tongue -breathing difficulties, wheezing -confusion -light headedness or fainting spells -severe stomach pain -unusually weak or tired -yellowing of the skin or the whites of the eyes Side effects that usually do not require medical attention (report  to your doctor or health care professional if they continue or are bothersome): -dizziness -drowsiness -nausea -vomiting This list may not describe all possible side effects. Call your doctor for medical advice about side effects. You may report side effects to FDA at 1-800-FDA-1088. Where should I keep my medicine? Keep out of the reach of children. This medicine can be abused. Keep your medicine in a safe place to protect it from theft. Do not share this medicine with anyone. Selling or giving away this medicine is dangerous and against the law. Store at room temperature between 20 and 25 degrees C (68 and 77 degrees F). Keep container tightly closed. Protect from light. This medicine may cause accidental overdose and death if it is taken by other adults, children, or pets. Flush any unused medicine down the toilet to reduce the chance of  harm. Do not use the medicine after the expiration date. NOTE: This sheet is a summary. It may not cover all possible information. If you have questions about this medicine, talk to your doctor, pharmacist, or health care provider.  2015, [employer] Standard. (2013-07-07 13:17:35)

## 2015-07-23 NOTE — ED Notes (Signed)
MD at bedside. 

## 2015-07-23 NOTE — ED Provider Notes (Signed)
CSN: [id number]     Arrival date & time 07/23/15  0450 History   First MD Initiated Contact with Patient 07/23/15 0502     Chief Complaint  Patient presents with  . Shoulder Pain     (Consider location/radiation/quality/duration/timing/severity/associated sxs/prior Treatment) Patient is a 54 y.o. male presenting with shoulder pain. The history is provided by the patient.  Shoulder Pain He injured his right shoulder one week ago when the stretcher started rolling out of and ambulance and he stopped it. He was seen in emergency and given a prescription for diclofenac which did not give him any relief. He was seen in the clinic for Workmen's Comp. and given a muscle relaxer. He does not remember which muscle relaxer he was given. These have not been giving him relief. He has been unable to sleep at night because of pain. He rates pain at 10/10. Ice does seem to give some slight, temporary relief. It is worse with any movement. He has also started to develop numbness of his right thumb.  Past Medical History  Diagnosis Date  . Allergy    History reviewed. No pertinent past surgical history. Family History  Problem Relation Age of Onset  . Hypertension Mother   . Diabetes Father   . Cancer Daughter     breast   Social History  Substance Use Topics  . Smoking status: Never Smoker   . Smokeless tobacco: None  . Alcohol Use: No    Review of Systems  All other systems reviewed and are negative.     Allergies  Review of patient's allergies indicates no known allergies.  Home Medications   Prior to Admission medications   Medication Sig Start Date End Date Taking? Authorizing Provider  betamethasone dipropionate (DIPROLENE) 0.05 % cream Apply topically 2 (two) times daily. 03/13/14   Emi Belfast, FNP  diazepam (VALIUM) 5 MG tablet Take 1 tablet (5 mg total) by mouth 2 (two) times daily as needed for muscle spasms. 07/23/15   Dione Booze, MD  fluticasone [facility]) 50 MCG/ACT  nasal spray Place into both nostrils as needed for allergies or rhinitis.    Historical Provider, MD  loratadine (CLARITIN) 10 MG tablet Take 10 mg by mouth as needed for allergies.    Historical Provider, MD  naproxen (NAPROSYN) 500 MG tablet Take 1 tablet (500 mg total) by mouth 2 (two) times daily. 07/23/15   Dione Booze, MD  oxyCODONE-acetaminophen (PERCOCET) 5-325 MG per tablet Take 1-2 tablets by mouth every 4 (four) hours as needed for moderate pain. 07/23/15   Dione Booze, MD   BP 173/104 mmHg  Pulse 82  Temp(Src) 98.1 F (36.7 C) (Oral)  Resp 18  Ht 6\' 2"  (1.88 m)  Wt 180 lb (81.647 kg)  BMI 23.10 kg/m2  SpO2 100% Physical Exam  Nursing note and vitals reviewed.  54 year old male, resting comfortably and in no acute distress. Vital signs are significant for hypertension. Oxygen saturation is 100%, which is normal. Head is normocephalic and atraumatic. PERRLA, EOMI. Oropharynx is clear. Neck is nontender and supple without adenopathy or JVD. Back is nontender and there is no CVA tenderness. Lungs are clear without rales, wheezes, or rhonchi. Chest is nontender. Heart has regular rate and rhythm without murmur. Abdomen is soft, flat, nontender without masses or hepatosplenomegaly and peristalsis is normoactive. Extremities: There is tenderness to palpation in the superior aspects of the right shoulder with maximum tenderness in the anterior and posterior deltoid grooves. Rotator cuff impingement  signs are present. Distal neurovascular exam is intact with strong pulses, prompt capillary refill, and normal sensation. No areas of decrease in sedation are identified.. Skin is warm and dry without rash. Neurologic: Mental status is normal, cranial nerves are intact, there are no motor or sensory deficits.  ED Course  Procedures (including critical care time)   MDM   Final diagnoses:  Rotator cuff strain, right, subsequent encounter    Right shoulder injury which seems to be a  rotator cuff injury. Old records are reviewed confirming prior ED visit with negative x-rays. He will be given prescriptions for naproxen, diazepam, and oxycodone-acetaminophen and is referred to orthopedics for follow-up.    Dione Booze, MD 07/23/15 [phone]

## 2015-07-23 NOTE — ED Notes (Addendum)
Pt had muscle sprain in R shoulder earlier this week, seen in our facility. States pain tonight is worse, ice/heat and muscle relaxer not effective. States pain radiates to hand. Numbness to R fingers.

## 2017-06-15 ENCOUNTER — Telehealth: Payer: Self-pay | Admitting: Surgical

## 2017-06-15 NOTE — Telephone Encounter (Signed)
Spoke with patient for Pre visit call. Patient is coming in Monday to Establish care. No major concerns would just like to discuss prostate. He has been seeing [facility] for this. [facility] and will sign medical release when he comes in on Monday.

## 2017-06-18 ENCOUNTER — Ambulatory Visit: Payer: Self-pay | Admitting: Family Medicine

## 2017-06-18 DIAGNOSIS — Z0289 Encounter for other administrative examinations: Secondary | ICD-10-CM

## 2017-06-28 ENCOUNTER — Ambulatory Visit (INDEPENDENT_AMBULATORY_CARE_PROVIDER_SITE_OTHER): Payer: 59 | Admitting: Family Medicine

## 2017-06-28 ENCOUNTER — Encounter: Payer: Self-pay | Admitting: Family Medicine

## 2017-06-28 VITALS — BP 144/82 | HR 75 | Temp 98.4°F | Ht 74.0 in | Wt 189.0 lb

## 2017-06-28 DIAGNOSIS — Z0000 Encounter for general adult medical examination without abnormal findings: Secondary | ICD-10-CM | POA: Diagnosis not present

## 2017-06-28 DIAGNOSIS — Z87898 Personal history of other specified conditions: Secondary | ICD-10-CM | POA: Diagnosis not present

## 2017-06-28 DIAGNOSIS — G5603 Carpal tunnel syndrome, bilateral upper limbs: Secondary | ICD-10-CM

## 2017-06-28 MED ORDER — DICLOFENAC SODIUM 75 MG PO TBEC: 75.0000 mg | DELAYED_RELEASE_TABLET | Freq: Two times a day (BID) | ORAL | 0 refills | Status: DC

## 2017-06-28 NOTE — Progress Notes (Signed)
Todd Rogers is a 56 y.o. male [facility]   Patient Care Team: Helane RimaWallace, Shavell Nored, DO as PCP - General (Family Medicine)   History of Present Illness:   Todd Rogers, CMA, acting as scribe for Dr. Earlene PlaterWallace.  HPI:  Patient comes in today to establish care.  He is a IT sales professionalfirefighter.  States he works 24 hours on and 48 hours off.    States he has been having some pain in bilateral hands.  Some tingling in his fingers as well.  This is worse at night when in bed.  Has woken him up from sleep.  States his hands feel "tight" at times.  States he has a habit of lying on his hands when sleeping sometimes.  This is worse in the right hand, particularly in the right thumb and index finger.  He states he has history of a right shoulder injury and states it is sore today.    History of elevated PSA.  States he had a biopsy that came back normal.  He was diagnosed with prostatitis and has had epididymitis as well.  PSA has returned to normal, per patient.  He sees a urologist every 6 months.    Health Maintenance Due  Topic Date Due  . Hepatitis C Screening  1961/05/19  . HIV Screening  01/09/1978  . TETANUS/TDAP  01/09/1982  . INFLUENZA VACCINE  06/27/2017    Depression screen PHQ 2/9 06/28/2017  Decreased Interest 0  Down, Depressed, Hopeless 0  PHQ - 2 Score 0    PMHx, SurgHx, SocialHx, Medications, and Allergies were reviewed in the Visit Navigator and updated as appropriate.   Past Medical History:  Diagnosis Date  . Allergy    No past surgical history on file.  Family History  Problem Relation Age of Onset  . Hypertension Mother   . Diabetes Father   . Cancer Daughter        breast   Social History  Substance Use Topics  . Smoking status: Never Smoker  . Smokeless tobacco: Never Used  . Alcohol use No   Current Medications and Allergies:   .  loratadine (CLARITIN) 10 MG tablet, Take 10 mg by mouth as needed for allergies., Disp: , Rfl:  .  Multiple Vitamin  (MULTIVITAMIN) tablet, Take 1 tablet by mouth daily., Disp: , Rfl:  .  diclofenac (VOLTAREN) 75 MG EC tablet, Take 1 tablet (75 mg total) by mouth 2 (two) times daily., Disp: 60 tablet, Rfl: 0  No Known Allergies   Review of Systems:   Pertinent items are noted in the HPI. Otherwise, ROS is negative.  Vitals:   Vitals:   06/28/17 1120  BP: (!) 144/82  Pulse: 75  Temp: 98.4 F (36.9 C)  TempSrc: Oral  SpO2: 98%  Weight: 189 lb (85.7 kg)  Height: 6\' 2"  (1.88 m)     Body mass index is 24.27 kg/m.  Physical Exam:   Physical Exam  Constitutional: He is oriented to person, place, and time. He appears well-developed and well-nourished.  HENT:  Head: Normocephalic and atraumatic.  Right Ear: External ear normal.  Left Ear: External ear normal.  Nose: Nose normal.  Mouth/Throat: Oropharynx is clear and moist.  Eyes: Pupils are equal, round, and reactive to light. Conjunctivae and EOM are normal.  Neck: Normal range of motion. Neck supple.  Cardiovascular: Normal rate, regular rhythm, normal heart sounds and intact distal pulses.   No murmur heard. Pulmonary/Chest: Effort normal and breath  sounds normal.  Abdominal: Soft. Bowel sounds are normal.  Musculoskeletal: Normal range of motion.  Neurological: He is alert and oriented to person, place, and time.  Skin: Skin is warm and dry.  Psychiatric: He has a normal mood and affect. His behavior is normal. Judgment and thought content normal.  Nursing note and vitals reviewed.  EKG: normal sinus rhythm.  Assessment and Plan:   Todd Rogers was seen today for establish care.  Diagnoses and all orders for this visit:  Routine physical examination Comments: Labs pending. EKG requested by patient today. 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  Orders: -     Comprehensive metabolic panel; Future -     Lipid panel; Future -     EKG 12-Lead  History of elevated PSA Comments: Patient followed by Urology. Would  like PSA tested today. Orders: -     PSA; Future  Bilateral carpal tunnel syndrome Comments: R > L. Also, likely with some CMC arthralgia. Discussed NSAIDs, splints, stretches, and reasons for referrral to [facility]. Orders: -     diclofenac (VOLTAREN) 75 MG EC tablet; Take 1 tablet (75 mg total) by mouth 2 (two) times daily.    . Reviewed expectations re: course of current medical issues. . Discussed self-management of symptoms. . Outlined signs and symptoms indicating need for more acute intervention. . Patient verbalized understanding and all questions were answered. Marland Kitchen. Health Maintenance issues including appropriate healthy diet, exercise, and smoking avoidance were discussed with patient. . See orders for this visit as documented in the electronic medical record. . Patient received an After Visit Summary.  CMA served as Neurosurgeonscribe during this visit. History, Physical, and Plan performed by medical provider. The above documentation has been reviewed and is accurate and complete. Helane RimaErica Yona Kosek, D.O.  Helane RimaErica Yamato Kopf, DO [place] 06/30/2017

## 2017-06-28 NOTE — Patient Instructions (Signed)
Recommend wearing a solid support wrist splint at night.  Try to practice stretching exercises everyday.

## 2017-06-30 DIAGNOSIS — G5603 Carpal tunnel syndrome, bilateral upper limbs: Secondary | ICD-10-CM | POA: Insufficient documentation

## 2017-07-10 DIAGNOSIS — R972 Elevated prostate specific antigen [PSA]: Secondary | ICD-10-CM | POA: Diagnosis not present

## 2017-07-11 ENCOUNTER — Telehealth: Payer: Self-pay | Admitting: Family Medicine

## 2017-07-11 NOTE — Telephone Encounter (Signed)
[facility]

## 2017-07-19 ENCOUNTER — Other Ambulatory Visit (INDEPENDENT_AMBULATORY_CARE_PROVIDER_SITE_OTHER): Payer: 59

## 2017-07-19 DIAGNOSIS — Z87898 Personal history of other specified conditions: Secondary | ICD-10-CM

## 2017-07-19 DIAGNOSIS — Z0000 Encounter for general adult medical examination without abnormal findings: Secondary | ICD-10-CM | POA: Diagnosis not present

## 2017-07-19 LAB — COMPREHENSIVE METABOLIC PANEL
ALT: 23 U/L (ref 0–53)
AST: 24 U/L (ref 0–37)
Albumin: 4.4 g/dL (ref 3.5–5.2)
Alkaline Phosphatase: 78 U/L (ref 39–117)
BUN: 13 mg/dL (ref 6–23)
CO2: 30 mEq/L (ref 19–32)
Calcium: 9.3 mg/dL (ref 8.4–10.5)
Chloride: 105 mEq/L (ref 96–112)
Creatinine, Ser: 1.01 mg/dL (ref 0.40–1.50)
GFR: 98.81 mL/min (ref >60.00)
Glucose, Bld: 87 mg/dL (ref 70–99)
Potassium: 4.3 mEq/L (ref 3.5–5.1)
Sodium: 141 mEq/L (ref 135–145)
Total Bilirubin: 0.6 mg/dL (ref 0.2–1.2)
Total Protein: 7 g/dL (ref 6.0–8.3)

## 2017-07-19 LAB — LIPID PANEL
Cholesterol: 179 mg/dL (ref 0–200)
HDL: 57 mg/dL (ref >39.00)
LDL Cholesterol: 112 mg/dL — ABNORMAL HIGH (ref 0–99)
NonHDL: 122.3
Total CHOL/HDL Ratio: 3
Triglycerides: 53 mg/dL (ref 0.0–149.0)
VLDL: 10.6 mg/dL (ref 0.0–40.0)

## 2017-07-19 LAB — PSA
PSA: 1.17
PSA: 1.17 ng/mL (ref 0.10–4.00)

## 2017-10-16 DIAGNOSIS — Z23 Encounter for immunization: Secondary | ICD-10-CM | POA: Diagnosis not present

## 2018-07-08 DIAGNOSIS — N4342 Spermatocele of epididymis, multiple: Secondary | ICD-10-CM | POA: Diagnosis not present

## 2018-07-08 DIAGNOSIS — R972 Elevated prostate specific antigen [PSA]: Secondary | ICD-10-CM | POA: Diagnosis not present

## 2018-07-08 LAB — PSA

## 2018-07-16 ENCOUNTER — Other Ambulatory Visit: Payer: Self-pay | Admitting: Urology

## 2018-07-23 DIAGNOSIS — N4342 Spermatocele of epididymis, multiple: Secondary | ICD-10-CM | POA: Insufficient documentation

## 2018-07-23 DIAGNOSIS — R972 Elevated prostate specific antigen [PSA]: Secondary | ICD-10-CM | POA: Insufficient documentation

## 2018-09-10 DIAGNOSIS — N4342 Spermatocele of epididymis, multiple: Secondary | ICD-10-CM | POA: Diagnosis not present

## 2018-09-13 ENCOUNTER — Other Ambulatory Visit: Payer: Self-pay | Admitting: Urology

## 2018-09-18 ENCOUNTER — Encounter (HOSPITAL_BASED_OUTPATIENT_CLINIC_OR_DEPARTMENT_OTHER): Payer: Self-pay | Admitting: *Deleted

## 2018-09-18 ENCOUNTER — Other Ambulatory Visit: Payer: Self-pay

## 2018-09-18 NOTE — Progress Notes (Signed)
Spoke with patient via telephone for pre op interview. NPO after MN. No medications AM of surgery. Arrival time 0530.  

## 2018-09-23 NOTE — H&P (Signed)
CC/HPI: Pt presents today for pre-operative history and physical exam in anticipation of bilateral spermatocelectomy on 09/24/18 by Dr. Annabell Howells. He is doing well and is without complaint.   Pt denies F/C, HA, CP, SOB, N/V, diarrhea/constipation, back pain, flank pain, hematuria, and dysuria.   HX:     CC: I have a knot in my scrotum.  HPI: Todd Rogers is a 57 year-old male established patient who is here for a knot in his scrotum.  He has a history of spermatoceles and has some increased swelling with discomfort which is worse at night.    His spermatocele is on both sides.     ALLERGIES: No Known Drug Allergies    MEDICATIONS: Claritin TABS Oral  Multi-Vitamin With Minerals 7.5 mg iron-400 mcg tablet Oral  Vitamin B-12 TABS Oral     Notes: Advil prn   GU PSH: None     PSH Notes: No Surgical Problems   NON-GU PSH: None   GU PMH: Elevated PSA (Stable), His PSA remains stable. He declined a rectal exam. - 07/08/2018, - 10/11/2016, Elevated prostate specific antigen (PSA), - 2017 Spermatocele (multiple) (Worsening), Bilateral, He has progressive enlargement of the spermatoceles and is interested in repair. I have reviewed the risks of bleeding, infection, testicular injury, recurrence, chronic pain, thrombotic events, anesthetic complications, testicular atrophy and infertility. - 07/08/2018, Spermatocele of epididymis, multiple, - 2016 Gross hematuria, Gross hematuria - 2017 Hematospermia, Hematospermia - 2017 BPH w/o LUTS, Benign prostatic hypertrophy without lower urinary tract symptoms - 2014    NON-GU PMH: Encounter for general adult medical examination without abnormal findings, Encounter for preventive health examination - 2017 Personal history of other diseases of the digestive system, History of esophageal reflux - 2016    FAMILY HISTORY: Diabetes - Father Family Health Status - Father alive at age 57 - Mother Family Health Status - Mother's Age - Mother Family  Health Status Number - Mother Hypertension - Mother   SOCIAL HISTORY: Marital Status: Married Preferred Language: English; Race: Black or African American Current Smoking Status: Patient has never smoked.  Does not use smokeless tobacco. Drinks 1 drink per month.  Does not use drugs. Drinks 2 caffeinated drinks per day. Has not had a blood transfusion. Patient's occupation Barrister's clerk.     Notes: Number of children, Married, Never a smoker, Alcohol Use, Occupation:, Caffeine Use, Marital History - Currently Married, Tobacco Use   REVIEW OF SYSTEMS:    GU Review Male:   Patient denies frequent urination, hard to postpone urination, burning/ pain with urination, get up at night to urinate, leakage of urine, stream starts and stops, trouble starting your stream, have to strain to urinate , erection problems, and penile pain.  Gastrointestinal (Upper):   Patient denies nausea, vomiting, and indigestion/ heartburn.  Gastrointestinal (Lower):   Patient denies diarrhea and constipation.  Constitutional:   Patient denies fever, night sweats, weight loss, and fatigue.  Skin:   bilateral groins. Patient reports skin rash/ lesion. Patient denies itching.  Eyes:   Patient denies blurred vision and double vision.  Ears/ Nose/ Throat:   Patient denies sore throat and sinus problems.  Hematologic/Lymphatic:   Patient denies swollen glands and easy bruising.  Cardiovascular:   Patient denies leg swelling and chest pains.  Respiratory:   Patient denies cough and shortness of breath.  Endocrine:   Patient denies excessive thirst.  Musculoskeletal:   Patient reports joint pain. Patient denies back pain.  Neurological:   Patient denies headaches and dizziness.  Psychologic:   Patient denies depression and anxiety.   VITAL SIGNS:      09/10/2018 02:11 PM  Weight 190 lb / 86.18 kg  Height 74 in / 187.96 cm  BP 132/85 mmHg  Pulse 73 /min  Temperature 98.2 F / 36.7 C  BMI 24.4 kg/m    MULTI-SYSTEM PHYSICAL EXAMINATION:    Constitutional: Well-nourished. No physical deformities. Normally developed. Good grooming.  Neck: Neck symmetrical, not swollen. Normal tracheal position.  Respiratory: Normal breath sounds. No labored breathing, no use of accessory muscles.   Cardiovascular: Regular rate and rhythm. No murmur, no gallop. Normal temperature, normal extremity pulses, no swelling, no varicosities.   Lymphatic: No enlargement of neck, axillae, groin.  Skin: No paleness, no jaundice, no cyanosis. No lesion, no ulcer, no rash.  Neurologic / Psychiatric: Oriented to time, oriented to place, oriented to person. No depression, no anxiety, no agitation.  Gastrointestinal: No mass, no tenderness, no rigidity, non obese abdomen.  Eyes: Normal conjunctivae. Normal eyelids.  Ears, Nose, Mouth, and Throat: Left ear no scars, no lesions, no masses. Right ear no scars, no lesions, no masses. Nose no scars, no lesions, no masses. Normal hearing. Normal lips.  Musculoskeletal: Normal gait and station of head and neck.     PAST DATA REVIEWED:  Source Of History:  Patient  Records Review:   Previous Patient Records   PROCEDURES: None   ASSESSMENT:      ICD-10 Details  1 GU:   Spermatocele (multiple) - N43.42    PLAN:           Schedule Return Visit/Planned Activity: Keep Scheduled Appointment - Schedule Surgery          Document Letter(s):  Created for Patient: Clinical Summary         Notes:   There are no changes in the patients history or physical exam since last evaluation by Dr. Annabell Howells. Pt is scheduled to undergo bilateral spermatocelectomy on 09/24/18. Pt was unable to provide a urine specimen today.    All pt's questions were answered to the best of my ability.          Next Appointment:      Next Appointment: 09/24/2018 07:30 AM    Appointment Type: Surgery     Location: [facility] [phone]    Provider: Bjorn Pippin, M.D.    Reason for  Visit: OP NE BIL SPERMATOCELECTOMY

## 2018-09-24 ENCOUNTER — Encounter (HOSPITAL_BASED_OUTPATIENT_CLINIC_OR_DEPARTMENT_OTHER)
Admission: RE | Disposition: A | Discharge status: Home / Self Care | Payer: Self-pay | Source: Ambulatory Visit | Attending: Urology

## 2018-09-24 ENCOUNTER — Encounter (HOSPITAL_BASED_OUTPATIENT_CLINIC_OR_DEPARTMENT_OTHER): Payer: Self-pay

## 2018-09-24 ENCOUNTER — Ambulatory Visit (HOSPITAL_BASED_OUTPATIENT_CLINIC_OR_DEPARTMENT_OTHER)
Admission: RE | Admit: 2018-09-24 | Discharge: 2018-09-24 | Disposition: A | Discharge status: Home / Self Care | Payer: 59 | Source: Ambulatory Visit | Attending: Urology | Admitting: Urology

## 2018-09-24 ENCOUNTER — Ambulatory Visit (HOSPITAL_BASED_OUTPATIENT_CLINIC_OR_DEPARTMENT_OTHER): Admit: 2018-09-24 | Payer: 59 | Admitting: Urology

## 2018-09-24 ENCOUNTER — Ambulatory Visit (HOSPITAL_BASED_OUTPATIENT_CLINIC_OR_DEPARTMENT_OTHER): Payer: 59 | Admitting: Certified Registered Nurse Anesthetist

## 2018-09-24 DIAGNOSIS — N4342 Spermatocele of epididymis, multiple: Secondary | ICD-10-CM

## 2018-09-24 HISTORY — PX: SPERMATOCELECTOMY: SHX2420

## 2018-09-24 SURGERY — EXCISION, SPERMATOCELE
Anesthesia: General | Laterality: Bilateral

## 2018-09-24 SURGERY — EXCISION, SPERMATOCELE
Anesthesia: General | Site: Scrotum | Laterality: Bilateral

## 2018-09-24 MED ORDER — SODIUM CHLORIDE 0.9 % IV SOLN
250.0000 mL | INTRAVENOUS | Status: DC | PRN
  Filled 2018-09-24: qty 250

## 2018-09-24 MED ORDER — FENTANYL CITRATE (PF) 100 MCG/2ML IJ SOLN
INTRAMUSCULAR | Status: DC | PRN
  Administered 2018-09-24: 25 ug via INTRAVENOUS
  Administered 2018-09-24: 50 ug via INTRAVENOUS
  Administered 2018-09-24 (×2): 25 ug via INTRAVENOUS

## 2018-09-24 MED ORDER — MORPHINE SULFATE (PF) 2 MG/ML IV SOLN
2.0000 mg | INTRAVENOUS | Status: DC | PRN
  Filled 2018-09-24: qty 1

## 2018-09-24 MED ORDER — PHENYLEPHRINE 40 MCG/ML (10ML) SYRINGE FOR IV PUSH (FOR BLOOD PRESSURE SUPPORT)
PREFILLED_SYRINGE | INTRAVENOUS | Status: DC | PRN
  Administered 2018-09-24: 80 ug via INTRAVENOUS

## 2018-09-24 MED ORDER — ACETAMINOPHEN 650 MG RE SUPP
650.0000 mg | RECTAL | Status: DC | PRN
  Filled 2018-09-24: qty 1

## 2018-09-24 MED ORDER — ONDANSETRON HCL 4 MG/2ML IJ SOLN
INTRAMUSCULAR | Status: DC | PRN
  Administered 2018-09-24: 4 mg via INTRAVENOUS

## 2018-09-24 MED ORDER — PHENYLEPHRINE 40 MCG/ML (10ML) SYRINGE FOR IV PUSH (FOR BLOOD PRESSURE SUPPORT)
PREFILLED_SYRINGE | INTRAVENOUS | Status: AC
  Filled 2018-09-24: qty 10

## 2018-09-24 MED ORDER — KETOROLAC TROMETHAMINE 30 MG/ML IJ SOLN
INTRAMUSCULAR | Status: DC | PRN
  Administered 2018-09-24: 30 mg via INTRAVENOUS

## 2018-09-24 MED ORDER — CEFAZOLIN SODIUM-DEXTROSE 2-4 GM/100ML-% IV SOLN
2.0000 g | INTRAVENOUS | Status: AC
  Administered 2018-09-24: 2 g via INTRAVENOUS
  Filled 2018-09-24: qty 100

## 2018-09-24 MED ORDER — OXYCODONE HCL 5 MG PO TABS
5.0000 mg | ORAL_TABLET | ORAL | Status: DC | PRN
  Filled 2018-09-24: qty 2

## 2018-09-24 MED ORDER — ONDANSETRON HCL 4 MG/2ML IJ SOLN
INTRAMUSCULAR | Status: AC
  Filled 2018-09-24: qty 2

## 2018-09-24 MED ORDER — ACETAMINOPHEN 325 MG PO TABS
650.0000 mg | ORAL_TABLET | ORAL | Status: DC | PRN
  Filled 2018-09-24: qty 2

## 2018-09-24 MED ORDER — BUPIVACAINE HCL (PF) 0.25 % IJ SOLN
INTRAMUSCULAR | Status: DC | PRN
  Administered 2018-09-24: 16 mL

## 2018-09-24 MED ORDER — MIDAZOLAM HCL 2 MG/2ML IJ SOLN
INTRAMUSCULAR | Status: AC
  Filled 2018-09-24: qty 2

## 2018-09-24 MED ORDER — CEFAZOLIN SODIUM-DEXTROSE 2-4 GM/100ML-% IV SOLN
INTRAVENOUS | Status: AC
  Filled 2018-09-24: qty 100

## 2018-09-24 MED ORDER — OXYCODONE HCL 5 MG/5ML PO SOLN
5.0000 mg | Freq: Once | ORAL | Status: DC | PRN
  Filled 2018-09-24: qty 5

## 2018-09-24 MED ORDER — DEXAMETHASONE SODIUM PHOSPHATE 10 MG/ML IJ SOLN
INTRAMUSCULAR | Status: AC
  Filled 2018-09-24: qty 1

## 2018-09-24 MED ORDER — PROPOFOL 500 MG/50ML IV EMUL
INTRAVENOUS | Status: AC
  Filled 2018-09-24: qty 50

## 2018-09-24 MED ORDER — PROPOFOL 10 MG/ML IV BOLUS
INTRAVENOUS | Status: DC | PRN
  Administered 2018-09-24: 200 mg via INTRAVENOUS

## 2018-09-24 MED ORDER — SODIUM CHLORIDE 0.9% FLUSH
3.0000 mL | Freq: Two times a day (BID) | INTRAVENOUS | Status: DC
  Filled 2018-09-24: qty 3

## 2018-09-24 MED ORDER — FENTANYL CITRATE (PF) 100 MCG/2ML IJ SOLN
25.0000 ug | INTRAMUSCULAR | Status: DC | PRN
  Filled 2018-09-24: qty 1

## 2018-09-24 MED ORDER — LACTATED RINGERS IV SOLN
INTRAVENOUS | Status: DC
  Administered 2018-09-24 (×2): via INTRAVENOUS
  Filled 2018-09-24: qty 1000

## 2018-09-24 MED ORDER — KETOROLAC TROMETHAMINE 30 MG/ML IJ SOLN
INTRAMUSCULAR | Status: AC
  Filled 2018-09-24: qty 1

## 2018-09-24 MED ORDER — FENTANYL CITRATE (PF) 100 MCG/2ML IJ SOLN
INTRAMUSCULAR | Status: AC
  Filled 2018-09-24: qty 2

## 2018-09-24 MED ORDER — MIDAZOLAM HCL 5 MG/5ML IJ SOLN
INTRAMUSCULAR | Status: DC | PRN
  Administered 2018-09-24: 2 mg via INTRAVENOUS

## 2018-09-24 MED ORDER — LIDOCAINE 2% (20 MG/ML) 5 ML SYRINGE
INTRAMUSCULAR | Status: AC
  Filled 2018-09-24: qty 5

## 2018-09-24 MED ORDER — HYDROCODONE-ACETAMINOPHEN 5-325 MG PO TABS: 1.0000 | ORAL_TABLET | ORAL | 0 refills | Status: DC | PRN

## 2018-09-24 MED ORDER — LIDOCAINE 2% (20 MG/ML) 5 ML SYRINGE
INTRAMUSCULAR | Status: DC | PRN
  Administered 2018-09-24: 100 mg via INTRAVENOUS

## 2018-09-24 MED ORDER — ONDANSETRON HCL 4 MG/2ML IJ SOLN
4.0000 mg | Freq: Once | INTRAMUSCULAR | Status: DC | PRN
  Filled 2018-09-24: qty 2

## 2018-09-24 MED ORDER — OXYCODONE HCL 5 MG PO TABS
5.0000 mg | ORAL_TABLET | Freq: Once | ORAL | Status: DC | PRN
  Filled 2018-09-24: qty 1

## 2018-09-24 MED ORDER — DEXAMETHASONE SODIUM PHOSPHATE 10 MG/ML IJ SOLN
INTRAMUSCULAR | Status: DC | PRN
  Administered 2018-09-24: 10 mg via INTRAVENOUS

## 2018-09-24 MED ORDER — SODIUM CHLORIDE 0.9% FLUSH
3.0000 mL | INTRAVENOUS | Status: DC | PRN
  Filled 2018-09-24: qty 3

## 2018-09-24 SURGICAL SUPPLY — 39 items
ADH SKN CLS APL DERMABOND .7 (GAUZE/BANDAGES/DRESSINGS) ×1
APPLICATOR COTTON TIP 6IN STRL (MISCELLANEOUS) ×3 IMPLANT
BLADE SURG 15 STRL LF DISP TIS (BLADE) ×1 IMPLANT
BLADE SURG 15 STRL SS (BLADE) ×3
BNDG GAUZE ELAST 4 BULKY (GAUZE/BANDAGES/DRESSINGS) ×3 IMPLANT
CANISTER SUCT 3000ML PPV (MISCELLANEOUS) IMPLANT
CANISTER SUCTION 1200CC (MISCELLANEOUS) IMPLANT
CLEANER CAUTERY TIP 5X5 PAD (MISCELLANEOUS) ×1 IMPLANT
CLOTH BEACON ORANGE TIMEOUT ST (SAFETY) ×3 IMPLANT
COVER BACK TABLE 60X90IN (DRAPES) ×3 IMPLANT
COVER MAYO STAND STRL (DRAPES) ×3 IMPLANT
DERMABOND ADVANCED (GAUZE/BANDAGES/DRESSINGS) ×2
DERMABOND ADVANCED .7 DNX12 (GAUZE/BANDAGES/DRESSINGS) IMPLANT
DISSECTOR ROUND CHERRY 3/8 STR (MISCELLANEOUS) IMPLANT
DRAIN PENROSE 18X1/4 LTX STRL (WOUND CARE) IMPLANT
DRAPE LAPAROTOMY 100X72 PEDS (DRAPES) ×3 IMPLANT
ELECT REM PT RETURN 9FT ADLT (ELECTROSURGICAL) ×3
ELECTRODE REM PT RTRN 9FT ADLT (ELECTROSURGICAL) ×1 IMPLANT
GAUZE SPONGE 4X4 12PLY STRL (GAUZE/BANDAGES/DRESSINGS) ×2 IMPLANT
GLOVE SURG SS PI 8.0 STRL IVOR (GLOVE) ×3 IMPLANT
GOWN W/2 COTTON TOWELS 2 STD (GOWNS) ×6 IMPLANT
KIT TURNOVER CYSTO (KITS) ×3 IMPLANT
MANIFOLD NEPTUNE II (INSTRUMENTS) IMPLANT
NEEDLE HYPO 22GX1.5 SAFETY (NEEDLE) ×3 IMPLANT
NS IRRIG 500ML POUR BTL (IV SOLUTION) ×2 IMPLANT
PACK BASIN DAY SURGERY FS (CUSTOM PROCEDURE TRAY) ×3 IMPLANT
PAD CLEANER CAUTERY TIP 5X5 (MISCELLANEOUS) ×2
PENCIL BUTTON HOLSTER BLD 10FT (ELECTRODE) ×3 IMPLANT
SUPPORT SCROTAL LG STRP (MISCELLANEOUS) ×2 IMPLANT
SUPPORTER ATHLETIC LG (MISCELLANEOUS) ×1
SUT CHROMIC 3 0 SH 27 (SUTURE) ×7 IMPLANT
SUT VICRYL 0 TIES 12 18 (SUTURE) ×3 IMPLANT
SYR BULB IRRIGATION 50ML (SYRINGE) IMPLANT
SYR CONTROL 10ML LL (SYRINGE) ×3 IMPLANT
TRAY DSU PREP LF (CUSTOM PROCEDURE TRAY) ×3 IMPLANT
TUBE CONNECTING 12'X1/4 (SUCTIONS) ×1
TUBE CONNECTING 12X1/4 (SUCTIONS) ×1 IMPLANT
WATER STERILE IRR 500ML POUR (IV SOLUTION) IMPLANT
YANKAUER SUCT BULB TIP NO VENT (SUCTIONS) ×2 IMPLANT

## 2018-09-24 NOTE — Transfer of Care (Signed)
Immediate Anesthesia Transfer of Care Note  Patient: Todd Rogers  Procedure(s) Performed: SPERMATOCELECTOMY (Bilateral Scrotum)  Patient Location: PACU  Anesthesia Type:General  Level of Consciousness: drowsy and patient cooperative  Airway & Oxygen Therapy: Patient Spontanous Breathing and Patient connected to nasal cannula oxygen  Post-op Assessment: Report given to RN and Post -op Vital signs reviewed and stable  Post vital signs: Reviewed and stable  Last Vitals:  Vitals Value Taken Time  BP    Temp    Pulse    Resp    SpO2      Last Pain:  Vitals:   09/24/18 0554  TempSrc:   PainSc: 0-No pain      Patients Stated Pain Goal: 5 (09/24/18 0554)  Complications: No apparent anesthesia complications

## 2018-09-24 NOTE — Interval H&P Note (Signed)
History and Physical Interval Note:  09/24/2018 7:24 AM  Todd Rogers  has presented today for surgery, with the diagnosis of BILATERAL SPERMATOCELES  The various methods of treatment have been discussed with the patient and family. After consideration of risks, benefits and other options for treatment, the patient has consented to  Procedure(s): SPERMATOCELECTOMY (Bilateral) as a surgical intervention .  The patient's history has been reviewed, patient examined, no change in status, stable for surgery.  I have reviewed the patient's chart and labs.  Questions were answered to the patient's satisfaction.     Bjorn Pippin

## 2018-09-24 NOTE — Op Note (Signed)
Procedure: Bilateral spermatocelectomy.  Preop diagnosis: Bilateral spermatoceles.  Postop diagnosis: Same.  Surgeon: Dr. Bjorn Pippin.  Anesthesia: General.  Specimen: None.  Drains: None.  EBL: Minimal.  Complications: None.  Indications: Todd Rogers is a 57 year old Afro-American male who has large bilateral spermatoceles and is elected spermatocelectomy.  Procedure: He was taken to the operating room was given 2 g of Ancef.  A general anesthetic was induced and he was fitted with PAS hose in the supine position.  His scrotum was clipped, he was prepped with Betadine solution and draped in usual sterile fashion.  A midline scrotal incision was made along the raphae with a knife.  The dartos was then incised over the left tunica vaginalis with the Bovie.  The tunica vaginalis was opened and the testicle delivered into the wound.  The attachments of the spermatocele to the testicle were then taken down using the Bovie until the left spermatocele neck was identified on the superior pole of the testicle.  The epididymis was divided in its distal portion as it was intimately involved with the spermatocele.  The epididymis was tied off with a 0 Vicryl tie.  The neck of the spermatocele was then clamped with a hemostat and the spermatocele was removed.  The neck was then ligated with a 0 Vicryl ties and oversewn with 3-0 chromic the great care being taken to avoid injury to the testicular blood supply.  The testicle remained viable following spermatocelectomy.  The right testicle was then delivered in an identical fashion and the large right spermatocele was dissected out and removed in an identical fashion.  The wound was inspected for hemostasis and additional minor bleeders were fulgurated.  Bilateral cord blocks were performed with 3 to 4 mL of quarter percent Marcaine and the testicles were placed within the scrotum.  The wounds were irrigated and inspected final time for hemostasis and no  active bleeding was noted.  The dartos was then closed using a running 3-0 chromic which incorporated the median septum.  An additional 8 mm of quarter percent Marcaine were injected adjacent to the skin edges of the wound.  The skin was then closed using a running 3-0 chromic vertical mattress suture.  Dermabond was used to reinforce the wound once it was cleaned and dried.  Once the Dermabond had dried a turban dressing was created using a Kerlix and once the drapes were removed a scrotal support was placed.  His anesthetic was reversed and he was moved to recovery room in stable condition.  There were no complications.  The spermatoceles appeared to be completely benign and were not sent for pathological evaluation.

## 2018-09-24 NOTE — Anesthesia Preprocedure Evaluation (Addendum)
Anesthesia Evaluation  Patient identified by MRN, date of birth, ID band Patient awake    Reviewed: Allergy & Precautions, NPO status , Patient's Chart, lab work & pertinent test results  History of Anesthesia Complications Negative for: history of anesthetic complications  Airway Mallampati: I  TM Distance: >3 FB Neck ROM: Full    Dental no notable dental hx.    Pulmonary neg pulmonary ROS,    Pulmonary exam normal        Cardiovascular negative cardio ROS Normal cardiovascular exam     Neuro/Psych negative neurological ROS  negative psych ROS   GI/Hepatic negative GI ROS, Neg liver ROS,   Endo/Other  negative endocrine ROS  Renal/GU negative Renal ROS  negative genitourinary   Musculoskeletal negative musculoskeletal ROS (+)   Abdominal   Peds  Hematology negative hematology ROS (+)   Anesthesia Other Findings   Reproductive/Obstetrics                            Anesthesia Physical Anesthesia Plan  ASA: I  Anesthesia Plan: General   Post-op Pain Management:    Induction: Intravenous  PONV Risk Score and Plan: 2 and Ondansetron, Dexamethasone, Midazolam and Treatment may vary due to age or medical condition  Airway Management Planned: LMA  Additional Equipment: None  Intra-op Plan:   Post-operative Plan: Extubation in OR  Informed Consent: I have reviewed the patients History and Physical, chart, labs and discussed the procedure including the risks, benefits and alternatives for the proposed anesthesia with the patient or authorized representative who has indicated his/her understanding and acceptance.     Plan Discussed with:   Anesthesia Plan Comments:        Anesthesia Quick Evaluation

## 2018-09-24 NOTE — Anesthesia Postprocedure Evaluation (Signed)
Anesthesia Post Note  Patient: CLEE PANDIT  Procedure(s) Performed: SPERMATOCELECTOMY (Bilateral Scrotum)     Patient location during evaluation: PACU Anesthesia Type: General Level of consciousness: awake and alert Pain management: pain level controlled Vital Signs Assessment: post-procedure vital signs reviewed and stable Respiratory status: spontaneous breathing, nonlabored ventilation and respiratory function stable Cardiovascular status: blood pressure returned to baseline and stable Postop Assessment: no apparent nausea or vomiting Anesthetic complications: no    Last Vitals:  Vitals:   09/24/18 0945 09/24/18 1000  BP: (!) 158/106 (!) 143/110  Pulse: 67 64  Resp: 14 16  Temp:  36.4 C  SpO2: 100% 97%    Last Pain:  Vitals:   09/24/18 1015  TempSrc:   PainSc: 0-No pain                 Lucretia Kern

## 2018-09-24 NOTE — Discharge Instructions (Addendum)
HOME CARE INSTRUCTIONS FOR SCROTAL PROCEDURES  Wound Care & Hygiene: You may apply an ice bag to the scrotum for the first 24 hours.  This may help decrease swelling and soreness.  You may have a dressing held in place by an athletic supporter.  You may remove the dressing in 24 hours and shower in 48 hours.  Continue to use the athletic supporter or tight briefs for at least a week. Activity: Rest today - not necessarily flat bed rest.  Just take it easy.  You should not do strenuous activities until your follow-up visit with your doctor.  You may resume light activity in 48 hours.  Return to Work:  Your doctor will advise you of this depending on the type of work you do  Diet: Drink liquids or eat a light diet this evening.  You may resume a regular diet tomorrow.  General Expectations: You may have a small amount of bleeding.  The scrotum may be swollen or bruised for about a week.  Call your Doctor if these occur:  -persistent or heavy bleeding  -temperature of 101 degrees or more  -severe pain, not relieved by your pain medication  Return to [place]:  Call to set up and appointment.  Patient Signature:  __________________________________________________  Nurse's Signature:  __________________________________________________    Call your surgeon if you experience:   1.  Fever over 101.0. 2.  Inability to urinate. 3.  Nausea and/or vomiting. 4.  Extreme swelling or bruising at the surgical site. 5.  Continued bleeding from the incision. 6.  Increased pain, redness or drainage from the incision. 7.  Problems related to your pain medication. 8.  Any problems and/or concerns    Post Anesthesia Home Care Instructions  Activity: Get plenty of rest for the remainder of the day. A responsible individual must stay with you for 24 hours following the procedure.  For the next 24 hours, DO NOT: -Drive a car -Advertising copywriter -Drink alcoholic beverages -Take any  medication unless instructed by your physician -Make any legal decisions or sign important papers.  Meals: Start with liquid foods such as gelatin or soup. Progress to regular foods as tolerated. Avoid greasy, spicy, heavy foods. If nausea and/or vomiting occur, drink only clear liquids until the nausea and/or vomiting subsides. Call your physician if vomiting continues.  Special Instructions/Symptoms: Your throat may feel dry or sore from the anesthesia or the breathing tube placed in your throat during surgery. If this causes discomfort, gargle with warm salt water. The discomfort should disappear within 24 hours.  If you had a scopolamine patch placed behind your ear for the management of post- operative nausea and/or vomiting:  1. The medication in the patch is effective for 72 hours, after which it should be removed.  Wrap patch in a tissue and discard in the trash. Wash hands thoroughly with soap and water. 2. You may remove the patch earlier than 72 hours if you experience unpleasant side effects which may include dry mouth, dizziness or visual disturbances. 3. Avoid touching the patch. Wash your hands with soap and water after contact with the patch.    NO IBUPROFEN PRODUCTS (MOTRIN, ADVIL) OR ALEVE UNTIL 2:45PM TODAY.

## 2018-09-24 NOTE — Anesthesia Procedure Notes (Signed)
Procedure Name: LMA Insertion Date/Time: 09/24/2018 7:33 AM Performed by: Pearson Grippe, CRNA Pre-anesthesia Checklist: Patient identified, Emergency Drugs available, Suction available and Patient being monitored Patient Re-evaluated:Patient Re-evaluated prior to induction Oxygen Delivery Method: Circle system utilized Preoxygenation: Pre-oxygenation with 100% oxygen Induction Type: IV induction Ventilation: Mask ventilation without difficulty LMA: LMA inserted LMA Size: 5.0 Number of attempts: 1 Airway Equipment and Method: Bite block Placement Confirmation: positive ETCO2 Tube secured with: Tape Dental Injury: Teeth and Oropharynx as per pre-operative assessment

## 2018-09-25 ENCOUNTER — Encounter (HOSPITAL_BASED_OUTPATIENT_CLINIC_OR_DEPARTMENT_OTHER): Payer: Self-pay | Admitting: Urology

## 2019-02-06 ENCOUNTER — Encounter: Payer: Self-pay | Admitting: Family Medicine

## 2019-02-06 ENCOUNTER — Other Ambulatory Visit: Payer: Self-pay

## 2019-02-06 ENCOUNTER — Ambulatory Visit (INDEPENDENT_AMBULATORY_CARE_PROVIDER_SITE_OTHER): Payer: 59 | Admitting: Family Medicine

## 2019-02-06 VITALS — BP 149/102 | HR 92 | Temp 98.6°F | Ht 74.0 in | Wt 199.4 lb

## 2019-02-06 DIAGNOSIS — R030 Elevated blood-pressure reading, without diagnosis of hypertension: Secondary | ICD-10-CM

## 2019-02-06 DIAGNOSIS — Z0001 Encounter for general adult medical examination with abnormal findings: Secondary | ICD-10-CM

## 2019-02-06 DIAGNOSIS — R5383 Other fatigue: Secondary | ICD-10-CM

## 2019-02-06 DIAGNOSIS — Z6825 Body mass index (BMI) 25.0-25.9, adult: Secondary | ICD-10-CM | POA: Diagnosis not present

## 2019-02-06 DIAGNOSIS — L219 Seborrheic dermatitis, unspecified: Secondary | ICD-10-CM

## 2019-02-06 LAB — TESTOSTERONE: Testosterone: 261.36 ng/dL — ABNORMAL LOW (ref 300.00–890.00)

## 2019-02-06 LAB — VITAMIN D 25 HYDROXY (VIT D DEFICIENCY, FRACTURES): VITD: 24.82 ng/mL — ABNORMAL LOW (ref 30.00–100.00)

## 2019-02-06 LAB — VITAMIN B12: Vitamin B-12: 658 pg/mL (ref 211–911)

## 2019-02-06 LAB — TSH: TSH: 0.69 u[IU]/mL (ref 0.35–4.50)

## 2019-02-06 MED ORDER — KETOCONAZOLE 2 % EX CREA: 1.0000 "application " | TOPICAL_CREAM | Freq: Two times a day (BID) | CUTANEOUS | 0 refills | Status: DC

## 2019-02-06 NOTE — Progress Notes (Signed)
Please inform patient of the following:  Testosterone and vitamin D are both low.  Please ask him to come back to discuss replacement options for testosterone.  Would like for him to start vitamin D 1000IU daily if he is not doing so already and we can recheck in 3-6 months.  Katina Degree. [name], MD 02/06/2019 9:25 PM

## 2019-02-06 NOTE — Progress Notes (Signed)
Chief Complaint:  Todd Rogers is a 58 y.o. male who presents today for his annual comprehensive physical exam.    Assessment/Plan:  Elevated blood pressure Patient's blood pressure well above goal today.  Recommended starting blood pressure medication however patient declined.  He wishes to continue with lifestyle modifications including decreased salt intake and cardio exercise.  Recommended patient limit diet to no more than 150 mg of sodium per day that he get at least 150 minutes of exercise weekly.  Discussed home blood pressure monitoring with goal 140/90 or lower.  He will follow-up with [place] in 1 to 2 weeks if pressures are consistently elevated.  Seborrheic dermatitis Rash consistent with seborrheic dermatitis.  No red flags.  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 Will check TSH, testosterone, B12, and vitamin D.  BMI 25 Discussed lifestyle modifications.  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  Up-to-date on colon cancer screening.  PSA within normal limits.  Patient Counseling(The following topics were reviewed and/or handout was given):  -Nutrition: Stressed importance of moderation in sodium/caffeine intake, saturated fat and cholesterol, caloric balance, sufficient intake of fresh fruits, vegetables, and fiber.  -Stressed the importance of regular exercise.   -Substance Abuse: Discussed cessation/primary prevention of tobacco, alcohol, or other drug use; driving or other dangerous activities under the influence; availability of treatment for abuse.   -Injury prevention: Discussed safety belts, safety helmets, smoke detector, smoking near bedding or upholstery.   -Sexuality: Discussed sexually transmitted diseases, partner selection, use of condoms, avoidance of unintended pregnancy and contraceptive alternatives.   -Dental health: Discussed importance of regular tooth brushing,  flossing, and dental visits.  -Health maintenance and immunizations reviewed. Please refer to Health maintenance section.  Return to care in 1 year for next preventative visit.     Subjective:  HPI:  He has no acute complaints today.   He has had a rash on and off for about a year located on his bilateral chest.  Rash is itchy, red, and intermittent in nature.  No precipitating events.  No treatments tried.  He has also had elevated blood pressure readings recently.  He tried cutting out caffeine.  He has also been trying to exercise more.  No chest pain or shortness of breath.  Patient also reports increasing fatigue for the past several months.  He was tried taking several over-the-counter vitamins with no improvement.  Lifestyle Diet: No specific diets. Exercise: No specific exercises.  Depression screen PHQ 2/9 02/06/2019  Decreased Interest 0  Down, Depressed, Hopeless 0  PHQ - 2 Score 0    Health Maintenance Due  Topic Date Due  . Hepatitis C Screening  1960-12-03  . HIV Screening  01/09/1976     ROS: Per HPI, otherwise a complete review of systems was negative.   PMH:  The following were reviewed and entered/updated in epic: Past Medical History:  Diagnosis Date  . Allergy    Patient Active Problem List   Diagnosis Date Noted  . Elevated PSA 07/23/2018  . Spermatocele of epididymis, multiple 07/23/2018   Past Surgical History:  Procedure Laterality Date  . HAND SURGERY  2009   patient stated he cut his hand, and part of a bone had to be removed.  Marland Kitchen SPERMATOCELECTOMY Bilateral 09/24/2018   Procedure: SPERMATOCELECTOMY;  Surgeon: Bjorn Pippin, MD;  Location: [facility];  Service: Urology;  Laterality: Bilateral;  . WISDOM TOOTH EXTRACTION  Family History  Problem Relation Age of Onset  . Hypertension Mother   . Diabetes Father   . Cancer Daughter        breast    Medications- reviewed and updated Current Outpatient Medications   Medication Sig Dispense Refill  . HYDROcodone-acetaminophen (NORCO) 5-325 MG tablet Take 1 tablet by mouth every 4 (four) hours as needed for moderate pain. 12 tablet 0  . loratadine (CLARITIN) 10 MG tablet Take 10 mg by mouth as needed for allergies.    . Multiple Vitamin (MULTIVITAMIN) tablet Take 1 tablet by mouth daily.    Marland Kitchen ketoconazole (NIZORAL) 2 % cream Apply 1 application topically 2 (two) times daily. 60 g 0   No current facility-administered medications for this visit.     Allergies-reviewed and updated No Known Allergies  Social History   Socioeconomic History  . Marital status: Married    Spouse name: Not on file  . Number of children: Not on file  . Years of education: Not on file  . Highest education level: Not on file  Occupational History  . Not on file  Social Needs  . Financial resource strain: Not on file  . Food insecurity:    Worry: Not on file    Inability: Not on file  . Transportation needs:    Medical: Not on file    Non-medical: Not on file  Tobacco Use  . Smoking status: Never Smoker  . Smokeless tobacco: Never Used  Substance and Sexual Activity  . Alcohol use: No  . Drug use: No  . Sexual activity: Yes  Lifestyle  . Physical activity:    Days per week: Not on file    Minutes per session: Not on file  . Stress: Not on file  Relationships  . Social connections:    Talks on phone: Not on file    Gets together: Not on file    Attends religious service: Not on file    Active member of club or organization: Not on file    Attends meetings of clubs or organizations: Not on file    Relationship status: Not on file  Other Topics Concern  . Not on file  Social History Narrative  . Not on file        Objective:  Physical Exam: BP (!) 149/102 (BP Location: Left Arm, Patient Position: Sitting, Cuff Size: Normal)   Pulse 92   Temp 98.6 F (37 C) (Oral)   Ht 6\' 2"  (1.88 m)   Wt 199 lb 6.1 oz (90.4 kg)   SpO2 98%   BMI 25.60 kg/m    Body mass index is 25.6 kg/m. Wt Readings from Last 3 Encounters:  02/06/19 199 lb 6.1 oz (90.4 kg)  09/24/18 193 lb 4.8 oz (87.7 kg)  06/28/17 189 lb (85.7 kg)   Gen: NAD, resting comfortably HEENT: TMs normal bilaterally. OP clear. No thyromegaly noted.  CV: RRR with no murmurs appreciated Pulm: NWOB, CTAB with no crackles, wheezes, or rhonchi GI: Normal bowel sounds present. Soft, Nontender, Nondistended. MSK: no edema, cyanosis, or clubbing noted Skin: warm, dry.  Erythematous scaly rash on lower edge of chest bilaterally. Neuro: CN2-12 grossly intact. Strength 5/5 in upper and lower extremities. Reflexes symmetric and intact bilaterally.  Psych: Normal affect and thought content     Caleb M. [name], MD 02/06/2019 2:26 PM

## 2019-02-12 DIAGNOSIS — N4342 Spermatocele of epididymis, multiple: Secondary | ICD-10-CM | POA: Diagnosis not present

## 2019-02-12 DIAGNOSIS — E291 Testicular hypofunction: Secondary | ICD-10-CM | POA: Diagnosis not present

## 2019-02-12 DIAGNOSIS — R972 Elevated prostate specific antigen [PSA]: Secondary | ICD-10-CM | POA: Diagnosis not present

## 2019-02-18 DIAGNOSIS — E291 Testicular hypofunction: Secondary | ICD-10-CM | POA: Diagnosis not present

## 2019-02-19 ENCOUNTER — Ambulatory Visit: Payer: 59 | Admitting: Family Medicine

## 2019-02-25 ENCOUNTER — Encounter: Payer: Self-pay | Admitting: Family Medicine

## 2019-02-26 ENCOUNTER — Encounter: Payer: Self-pay | Admitting: Family Medicine

## 2019-02-26 ENCOUNTER — Ambulatory Visit (INDEPENDENT_AMBULATORY_CARE_PROVIDER_SITE_OTHER): Payer: 59 | Admitting: Family Medicine

## 2019-02-26 VITALS — Ht 74.0 in

## 2019-02-26 DIAGNOSIS — R030 Elevated blood-pressure reading, without diagnosis of hypertension: Secondary | ICD-10-CM | POA: Insufficient documentation

## 2019-02-26 DIAGNOSIS — I10 Essential (primary) hypertension: Secondary | ICD-10-CM | POA: Insufficient documentation

## 2019-02-26 DIAGNOSIS — R7989 Other specified abnormal findings of blood chemistry: Secondary | ICD-10-CM

## 2019-02-26 NOTE — Progress Notes (Signed)
    Chief Complaint:  Todd Rogers is a 58 y.o. male who presents today for a virtual office visit with a chief complaint of low testosterone.   Assessment/Plan:  Low testosterone level Discussed risks and benefits of testosterone replacement including increased risk of heart attack, stroke, VTE, and certain forms of cancer.  Patient declined starting placement therapy at this time.  Additionally repeat testing was reportedly found to have normal testosterone levels.  Low vitamin D level Continue 2000 international units daily.  Recheck vitamin D level in 3 to 6 months.    Elevated blood pressure reading Reportedly normal at his urologist office.  Continue home blood pressure monitoring with goal 140/90.  Follow-up with me in 3 to 6 months.     Subjective:  HPI:  Low Testosterone / Fatigue / Vitamin D Deficiency  Patient found to have low testosterone and low vitamin D on blood work 3 weeks ago.  He has since restarted vitamin D supplementation 2000 international units daily.  Has had noted significant improvement in his energy levels after starting vitamin D replacement.  He followed up with his urologist a couple weeks ago and had testosterone rechecked.  Was reportedly normal range.  His urologist advised against testosterone replacement that time.  Elevated blood pressure Patient found to have elevated blood pressure reading at his last office visit.  He reportedly had a checked at his urologist and was within normal range.  Does not have any reported chest pain or shortness of breath.  ROS: Per HPI  PMH: He reports that he has never smoked. He has never used smokeless tobacco. He reports that he does not drink alcohol or use drugs.      Objective/Observations  Physical Exam: Gen: NAD, resting comfortably Pulm: Normal work of breathing Psych: Normal affect and thought content  Virtual Visit via Video   I connected with Todd Rogers on 02/26/19 at 10:40 AM EDT by a  video enabled telemedicine application and verified that I am speaking with the correct person using two identifiers. I discussed the limitations of evaluation and management by telemedicine and the availability of in person appointments. The patient expressed understanding and agreed to proceed. The video component of the telemedicine application did not perform functionally and this visit was then converted to an audio only appointment.  Patient location: Home Provider location: [facility] Persons participating in the virtual visit: Myself and patient  I spent 11 minutes discussing medical information with the patient during this encounter.     Katina Degree. [name], MD 02/26/2019 10:59 AM

## 2019-02-26 NOTE — Assessment & Plan Note (Signed)
Reportedly normal at his urologist office.  Continue home blood pressure monitoring with goal 140/90.  Follow-up with me in 3 to 6 months.

## 2019-02-26 NOTE — Patient Instructions (Signed)
Health Maintenance Due  Topic Date Due  . Hepatitis C Screening  [birth year]  . HIV Screening  01/09/1978    Depression screen [facility] 2/9 02/06/2019 06/28/2017  Decreased Interest 0 0  Down, Depressed, Hopeless 0 0  PHQ - 2 Score 0 0

## 2019-02-26 NOTE — Assessment & Plan Note (Signed)
Continue 2000 international units daily.  Recheck vitamin D level in 3 to 6 months.

## 2020-03-10 ENCOUNTER — Telehealth (INDEPENDENT_AMBULATORY_CARE_PROVIDER_SITE_OTHER): Payer: 59 | Admitting: Physician Assistant

## 2020-03-10 ENCOUNTER — Encounter: Payer: Self-pay | Admitting: Physician Assistant

## 2020-03-10 DIAGNOSIS — M546 Pain in thoracic spine: Secondary | ICD-10-CM

## 2020-03-10 MED ORDER — TIZANIDINE HCL 4 MG PO TABS: 4.0000 mg | ORAL_TABLET | Freq: Four times a day (QID) | ORAL | 0 refills | Status: DC | PRN

## 2020-03-10 MED ORDER — DICLOFENAC SODIUM 75 MG PO TBEC: 75.0000 mg | DELAYED_RELEASE_TABLET | Freq: Two times a day (BID) | ORAL | 0 refills | Status: DC

## 2020-03-10 NOTE — Progress Notes (Signed)
Virtual Visit via Video   I connected with Todd Rogers on 03/10/20 at  1:00 PM EDT by a video enabled telemedicine application and verified that I am speaking with the correct person using two identifiers. Location patient: Home Location provider: [facility], Office Persons participating in the virtual visit: KEANEN DOHSE, Andreas Blower, [place], [place]  I discussed the limitations of evaluation and management by telemedicine and the availability of in person appointments. The patient expressed understanding and agreed to proceed.  I acted as a Neurosurgeon for [employer], [employer], [place]  Subjective:   HPI:   Muscle Strain Pt is c/o of muscle spasms of back and is requesting a muscle relaxer that he used previously. He lifted something heavy that caused his back to spasm.  Patient reports in the past he has used tizanidine as well as diclofenac for his symptoms.  He states that his onset of mid back pain has occurred like his past episodes and denies any unusual pain, bowel or bladder incontinence, or numbness and tingling to lower extremities.  He states that typically he will take these medications and feel relief by the next day.  ROS: See pertinent positives and negatives per HPI.  Patient Active Problem List   Diagnosis Date Noted  . Low vitamin D level 02/26/2019  . Elevated blood pressure reading 02/26/2019  . Elevated PSA 07/23/2018  . Spermatocele of epididymis, multiple 07/23/2018    Social History   Tobacco Use  . Smoking status: Never Smoker  . Smokeless tobacco: Never Used  Substance Use Topics  . Alcohol use: No    Current Outpatient Medications:  .  cholecalciferol (VITAMIN D3) 25 MCG (1000 UT) tablet, Take 1,000 Units by mouth daily., Disp: , Rfl:  .  loratadine (CLARITIN) 10 MG tablet, Take 10 mg by mouth as needed for allergies., Disp: , Rfl:  .  Multiple Vitamin (MULTIVITAMIN) tablet, Take 1 tablet by mouth daily., Disp: , Rfl:    .  diclofenac (VOLTAREN) 75 MG EC tablet, Take 1 tablet (75 mg total) by mouth 2 (two) times daily., Disp: 30 tablet, Rfl: 0 .  HYDROcodone-acetaminophen (NORCO) 5-325 MG tablet, Take 1 tablet by mouth every 4 (four) hours as needed for moderate pain. (Patient not taking: Reported on 03/10/2020), Disp: 12 tablet, Rfl: 0 .  ketoconazole (NIZORAL) 2 % cream, Apply 1 application topically 2 (two) times daily. (Patient not taking: Reported on 03/10/2020), Disp: 60 g, Rfl: 0 .  tiZANidine (ZANAFLEX) 4 MG tablet, Take 1 tablet (4 mg total) by mouth every 6 (six) hours as needed for muscle spasms., Disp: 30 tablet, Rfl: 0  No Known Allergies  Objective:   VITALS: Per patient if applicable, see vitals. GENERAL: Alert, appears well and in no acute distress. HEENT: Atraumatic, conjunctiva clear, no obvious abnormalities on inspection of external nose and ears. NECK: Normal movements of the head and neck. CARDIOPULMONARY: No increased WOB. Speaking in clear sentences. I:E ratio WNL.  MS: Moves all visible extremities without noticeable abnormality. PSYCH: Pleasant and cooperative, well-groomed. Speech normal rate and rhythm. Affect is appropriate. Insight and judgement are appropriate. Attention is focused, linear, and appropriate.  NEURO: CN grossly intact. Oriented as arrived to appointment on time with no prompting. Moves both UE equally.  SKIN: No obvious lesions, wounds, erythema, or cyanosis noted on face or hands.  Assessment and Plan:   Ilian was seen today for spasms.  Diagnoses and all orders for this visit:  Acute bilateral thoracic back pain  Other orders -     tiZANidine (ZANAFLEX) 4 MG tablet; Take 1 tablet (4 mg total) by mouth every 6 (six) hours as needed for muscle spasms. -     diclofenac (VOLTAREN) 75 MG EC tablet; Take 1 tablet (75 mg total) by mouth 2 (two) times daily.   I have refilled his Zanaflex and diclofenac.  I recommended that he avoid ibuprofen while taking  diclofenac.  I also recommended close follow-up if his symptoms do not improve with this medication or if they worsen.  Patient verbalized understanding to plan and worsening precautions were advised.  . Reviewed expectations re: course of current medical issues. . Discussed self-management of symptoms. . Outlined signs and symptoms indicating need for more acute intervention. . Patient verbalized understanding and all questions were answered. Marland Kitchen Health Maintenance issues including appropriate healthy diet, exercise, and smoking avoidance were discussed with patient. . See orders for this visit as documented in the electronic medical record.  I discussed the assessment and treatment plan with the patient. The patient was provided an opportunity to ask questions and all were answered. The patient agreed with the plan and demonstrated an understanding of the instructions.   The patient was advised to call back or seek an in-person evaluation if the symptoms worsen or if the condition fails to improve as anticipated.   CMA or LPN served as scribe during this visit. History, Physical, and Plan performed by medical provider. The above documentation has been reviewed and is accurate and complete.   [place], [place] 03/10/2020

## 2020-03-16 ENCOUNTER — Other Ambulatory Visit: Payer: Self-pay

## 2020-03-16 ENCOUNTER — Ambulatory Visit (INDEPENDENT_AMBULATORY_CARE_PROVIDER_SITE_OTHER): Payer: 59 | Admitting: Family Medicine

## 2020-03-16 ENCOUNTER — Encounter: Payer: Self-pay | Admitting: Family Medicine

## 2020-03-16 VITALS — BP 160/100 | HR 88 | Temp 98.0°F | Ht 74.0 in | Wt 200.8 lb

## 2020-03-16 DIAGNOSIS — R030 Elevated blood-pressure reading, without diagnosis of hypertension: Secondary | ICD-10-CM | POA: Diagnosis not present

## 2020-03-16 DIAGNOSIS — S29012A Strain of muscle and tendon of back wall of thorax, initial encounter: Secondary | ICD-10-CM | POA: Diagnosis not present

## 2020-03-16 MED ORDER — PREDNISONE 50 MG PO TABS: ORAL_TABLET | ORAL | 0 refills | Status: DC

## 2020-03-16 NOTE — Assessment & Plan Note (Signed)
Elevated today in setting of acute pain.  Has previously been well controlled at home.  Continue home monitoring goal 140/90 or lower.

## 2020-03-16 NOTE — Progress Notes (Signed)
   Todd Rogers is a 59 y.o. male who presents today for an office visit.  Assessment/Plan:  New/Acute Problems: Rhomboid strain No red flags.  Will start 5-day course of prednisone.  Discussed home exercises and handout was given.  If no improvement in next 5 to 7 days, would consider injection or referral to sports med/PT peer discussed reasons to return to care.  Chronic Problems Addressed Today: Elevated blood pressure reading Elevated today in setting of acute pain.  Has previously been well controlled at home.  Continue home monitoring goal 140/90 or lower.     Subjective:  HPI:  Patient here with back pain.  Started about a week ago.  Is been doing more yard work and has been using Motrin.  Thinks that he may have overused it and injured it at that time.  He was seen about a week ago and started on diclofenac and Zanaflex.  Symptoms have improved modestly though still has significant pain with minimal movement.  Pain improves throughout the day.  He has tried using a home TENS unit which is helped.  Sometimes has numbness radiating to his left fingers.  No reported bowel or bladder incontinence.  No other weakness or numbness.        Objective:  Physical Exam: BP (!) 160/100 (BP Location: Left Arm, Patient Position: Sitting, Cuff Size: Normal)   Pulse 88   Temp 98 F (36.7 C) (Temporal)   Ht 6\' 2"  (1.88 m)   Wt 200 lb 12.8 oz (91.1 kg)   SpO2 98%   BMI 25.78 kg/m   Gen: No acute distress, resting comfortably MSK: -Back: No deformities.  Tender palpation along left rhomboid muscle group.  Bilateral upper extremities with 5 out of 5 strength throughout and normal sensation to light touch. Psych: Normal affect and thought content      Todd Mele M. , MD 03/16/2020 1:23 PM

## 2020-03-16 NOTE — Patient Instructions (Signed)
It was very nice to see you today!  You have a strain in your rhomboid.  Please start the prednisone and work on the exercises.   Let me know if not improving in the next 5 to 7 days.  Take care, Dr [name]  Please try these tips to maintain a healthy lifestyle:   Eat at least 3 REAL meals and 1-2 snacks per day.  Aim for no more than 5 hours between eating.  If you eat breakfast, please do so within one hour of getting up.    Each meal should contain half fruits/vegetables, one quarter protein, and one quarter carbs (no bigger than a computer mouse)   Cut down on sweet beverages. This includes juice, soda, and sweet tea.     Drink at least 1 glass of water with each meal and aim for at least 8 glasses per day   Exercise at least 150 minutes every week.

## 2020-12-23 ENCOUNTER — Encounter: Payer: Self-pay | Admitting: Physician Assistant

## 2020-12-23 ENCOUNTER — Other Ambulatory Visit: Payer: Self-pay

## 2020-12-23 ENCOUNTER — Ambulatory Visit (INDEPENDENT_AMBULATORY_CARE_PROVIDER_SITE_OTHER): Payer: 59 | Admitting: Physician Assistant

## 2020-12-23 VITALS — BP 140/100 | HR 85 | Temp 97.5°F | Ht 74.0 in | Wt 202.0 lb

## 2020-12-23 DIAGNOSIS — R030 Elevated blood-pressure reading, without diagnosis of hypertension: Secondary | ICD-10-CM | POA: Diagnosis not present

## 2020-12-23 DIAGNOSIS — H61892 Other specified disorders of left external ear: Secondary | ICD-10-CM | POA: Diagnosis not present

## 2020-12-23 NOTE — Progress Notes (Signed)
Subjective:    Patient ID: Todd Rogers, male    DOB: 1961/08/30, 60 y.o.   MRN: [id number]  Chief Complaint  Patient presents with  . Ear Pain    2 days ago the patient had pain in his L ear. But now the pain is going but the ear is irritated.     HPI Patient is in today for left ear "irritation" on and off for the last two weeks. Uses Q-tips. Does not wear ear plugs. Denies any drainage or bleeding. He has normal hearing.  Past Medical History:  Diagnosis Date  . Allergy     Past Surgical History:  Procedure Laterality Date  . HAND SURGERY  2009   patient stated he cut his hand, and part of a bone had to be removed.  Marland Kitchen SPERMATOCELECTOMY Bilateral 09/24/2018   Procedure: SPERMATOCELECTOMY;  Surgeon: Bjorn Pippin, MD;  Location: [facility];  Service: Urology;  Laterality: Bilateral;  . WISDOM TOOTH EXTRACTION      Family History  Problem Relation Age of Onset  . Hypertension Mother   . Diabetes Father   . Cancer Daughter        breast    Social History   Socioeconomic History  . Marital status: Married    Spouse name: Not on file  . Number of children: Not on file  . Years of education: Not on file  . Highest education level: Not on file  Occupational History  . Not on file  Tobacco Use  . Smoking status: Never Smoker  . Smokeless tobacco: Never Used  Vaping Use  . Vaping Use: Never used  Substance and Sexual Activity  . Alcohol use: No  . Drug use: No  . Sexual activity: Yes  Other Topics Concern  . Not on file  Social History Narrative  . Not on file   Social Determinants of Health   Financial Resource Strain: Not on file  Food Insecurity: Not on file  Transportation Needs: Not on file  Physical Activity: Not on file  Stress: Not on file  Social Connections: Not on file  Intimate Partner Violence: Not on file    Outpatient Medications Prior to Visit  Medication Sig Dispense Refill  . cholecalciferol (VITAMIN D3) 25 MCG  (1000 UT) tablet Take 1,000 Units by mouth daily.    Marland Kitchen loratadine (CLARITIN) 10 MG tablet Take 10 mg by mouth as needed for allergies.    . Multiple Vitamin (MULTIVITAMIN) tablet Take 1 tablet by mouth daily.    . diclofenac (VOLTAREN) 75 MG EC tablet Take 1 tablet (75 mg total) by mouth 2 (two) times daily. (Patient not taking: Reported on 12/23/2020) 30 tablet 0  . HYDROcodone-acetaminophen (NORCO) 5-325 MG tablet Take 1 tablet by mouth every 4 (four) hours as needed for moderate pain. (Patient not taking: Reported on 03/10/2020) 12 tablet 0  . ketoconazole (NIZORAL) 2 % cream Apply 1 application topically 2 (two) times daily. (Patient not taking: No sig reported) 60 g 0  . predniSONE (DELTASONE) 50 MG tablet Take 1 tablet daily for 5 days. (Patient not taking: Reported on 12/23/2020) 5 tablet 0  . tiZANidine (ZANAFLEX) 4 MG tablet Take 1 tablet (4 mg total) by mouth every 6 (six) hours as needed for muscle spasms. (Patient not taking: Reported on 12/23/2020) 30 tablet 0   No facility-administered medications prior to visit.    No Known Allergies  Review of Systems REFER TO HPI FOR PERTINENT ROS  Objective:    Physical Exam Constitutional:      Appearance: Normal appearance.  HENT:     Head: Normocephalic and atraumatic.     Right Ear: Hearing, tympanic membrane, ear canal and external ear normal.     Left Ear: Hearing, tympanic membrane, ear canal and external ear normal.  Cardiovascular:     Rate and Rhythm: Normal rate and regular rhythm.  Pulmonary:     Effort: Pulmonary effort is normal.     Breath sounds: Normal breath sounds.  Neurological:     Mental Status: He is alert.     BP (!) 140/100   Pulse 85   Temp (!) 97.5 F (36.4 C) (Temporal)   Ht 6\' 2"  (1.88 m)   Wt 202 lb (91.6 kg)   SpO2 97%   BMI 25.94 kg/m   Vitals with BMI 12/23/2020 03/16/2020 02/26/2019  Height 6\' 2"  6\' 2"  6\' 2"   Weight 202 lbs 200 lbs 13 oz -  BMI 25.92 25.77 -  Systolic 140 160 -   Diastolic 100 100 -  Pulse 85 88 -       Assessment & Plan:   Problem List Items Addressed This Visit      Other   Elevated blood pressure reading    Other Visit Diagnoses    Irritation of external ear canal, left    -  Primary     1. Left ear canal irritation - normal exam today. No evidence of infection or cerumen. May be intermittent irritation from dryness. Twice daily OTC hydrocortisone 1% cream with moisturizer may help. Recheck if worse or no improvement.  2. Elevated blood pressure - Will have patient check at home twice daily and keep a log (he states he has a way to check). Bring log to next appointment - schedule f/up with Dr. 04/28/2019 in 2-3 weeks or CPE whichever is available sooner.   No orders of the defined types were placed in this encounter.   Lanayah Gartley M Harmani Neto, PA-C

## 2020-12-23 NOTE — Patient Instructions (Addendum)
Pleasure meeting you today!   You ear exam was normal. You may try a small amount of 1% hydrocortisone cream twice daily on the area that is irritated. Recheck if any pain or worsening symptoms.  Your blood pressure is also running high. Please schedule a CPE with Dr. [name]; if not available for CPE in the next few weeks, please schedule a return visit to discuss blood pressure with him.  Keep log of blood pressure twice daily at home & bring to next visit. Limit salt. Drink plenty of water and exercise.

## 2021-01-06 ENCOUNTER — Other Ambulatory Visit: Payer: Self-pay

## 2021-01-06 ENCOUNTER — Ambulatory Visit (INDEPENDENT_AMBULATORY_CARE_PROVIDER_SITE_OTHER): Payer: 59 | Admitting: Family Medicine

## 2021-01-06 ENCOUNTER — Encounter: Payer: Self-pay | Admitting: Family Medicine

## 2021-01-06 VITALS — BP 136/88 | HR 85 | Temp 97.6°F | Ht 74.0 in | Wt 205.6 lb

## 2021-01-06 DIAGNOSIS — Z0001 Encounter for general adult medical examination with abnormal findings: Secondary | ICD-10-CM

## 2021-01-06 DIAGNOSIS — E663 Overweight: Secondary | ICD-10-CM

## 2021-01-06 DIAGNOSIS — R972 Elevated prostate specific antigen [PSA]: Secondary | ICD-10-CM

## 2021-01-06 DIAGNOSIS — Z6826 Body mass index (BMI) 26.0-26.9, adult: Secondary | ICD-10-CM

## 2021-01-06 DIAGNOSIS — R7989 Other specified abnormal findings of blood chemistry: Secondary | ICD-10-CM | POA: Diagnosis not present

## 2021-01-06 DIAGNOSIS — R030 Elevated blood-pressure reading, without diagnosis of hypertension: Secondary | ICD-10-CM | POA: Diagnosis not present

## 2021-01-06 NOTE — Patient Instructions (Signed)
It was very nice to see you today!  Keep an eye on your blood pressure.  Let me know if persistently 140/90 or higher.  Please send me your blood work when you get a chance.  You will be due for your next colon cancer screening in 3 years.  I will see back in year for your next annual physical.  Please come back to see me sooner if needed.  Take care, Dr [name]  Please try these tips to maintain a healthy lifestyle:   Eat at least 3 REAL meals and 1-2 snacks per day.  Aim for no more than 5 hours between eating.  If you eat breakfast, please do so within one hour of getting up.    Each meal should contain half fruits/vegetables, one quarter protein, and one quarter carbs (no bigger than a computer mouse)   Cut down on sweet beverages. This includes juice, soda, and sweet tea.     Drink at least 1 glass of water with each meal and aim for at least 8 glasses per day   Exercise at least 150 minutes every week.    Preventive Care 62-59 Years Old, Male Preventive care refers to lifestyle choices and visits with your health care provider that can promote health and wellness. This includes:  A yearly physical exam. This is also called an annual wellness visit.  Regular dental and eye exams.  Immunizations.  Screening for certain conditions.  Healthy lifestyle choices, such as: ? Eating a healthy diet. ? Getting regular exercise. ? Not using drugs or products that contain nicotine and tobacco. ? Limiting alcohol use. What can I expect for my preventive care visit? Physical exam Your health care provider will check your:  Height and weight. These may be used to calculate your BMI (body mass index). BMI is a measurement that tells if you are at a healthy weight.  Heart rate and blood pressure.  Body temperature.  Skin for abnormal spots. Counseling Your health care provider may ask you questions about your:  Past medical problems.  Family's medical  history.  Alcohol, tobacco, and drug use.  Emotional well-being.  Home life and relationship well-being.  Sexual activity.  Diet, exercise, and sleep habits.  Work and work Astronomer.  Access to firearms. What immunizations do I need? Vaccines are usually given at various ages, according to a schedule. Your health care provider will recommend vaccines for you based on your age, medical history, and lifestyle or other factors, such as travel or where you work.   What tests do I need? Blood tests  Lipid and cholesterol levels. These may be checked every 5 years, or more often if you are over 48 years old.  Hepatitis C test.  Hepatitis B test. Screening  Lung cancer screening. You may have this screening every year starting at age 9 if you have a 30-pack-year history of smoking and currently smoke or have quit within the past 15 years.  Prostate cancer screening. Recommendations will vary depending on your family history and other risks.  Genital exam to check for testicular cancer or hernias.  Colorectal cancer screening. ? All adults should have this screening starting at age 88 and continuing until age 63. ? Your health care provider may recommend screening at age 16 if you are at increased risk. ? You will have tests every 1-10 years, depending on your results and the type of screening test.  Diabetes screening. ? This is done by checking your blood  sugar (glucose) after you have not eaten for a while (fasting). ? You may have this done every 1-3 years.  STD (sexually transmitted disease) testing, if you are at risk. Follow these instructions at home: Eating and drinking  Eat a diet that includes fresh fruits and vegetables, whole grains, lean protein, and low-fat dairy products.  Take vitamin and mineral supplements as recommended by your health care provider.  Do not drink alcohol if your health care provider tells you not to drink.  If you drink  alcohol: ? Limit how much you have to 0-2 drinks a day. ? Be aware of how much alcohol is in your drink. In the [place] one drink equals one 12 oz bottle of beer (355 mL), one 5 oz glass of wine (148 mL), or one 1 oz glass of hard liquor (44 mL).   Lifestyle  Take daily care of your teeth and gums. Brush your teeth every morning and night with fluoride toothpaste. Floss one time each day.  Stay active. Exercise for at least 30 minutes 5 or more days each week.  Do not use any products that contain nicotine or tobacco, such as cigarettes, e-cigarettes, and chewing tobacco. If you need help quitting, ask your health care provider.  Do not use drugs.  If you are sexually active, practice safe sex. Use a condom or other form of protection to prevent STIs (sexually transmitted infections).  If told by your health care provider, take low-dose aspirin daily starting at age 6.  Find healthy ways to cope with stress, such as: ? Meditation, yoga, or listening to music. ? Journaling. ? Talking to a trusted person. ? Spending time with friends and family. Safety  Always wear your seat belt while driving or riding in a vehicle.  Do not drive: ? If you have been drinking alcohol. Do not ride with someone who has been drinking. ? When you are tired or distracted. ? While texting.  Wear a helmet and other protective equipment during sports activities.  If you have firearms in your house, make sure you follow all gun safety procedures. What's next?  Go to your health care provider once a year for an annual wellness visit.  Ask your health care provider how often you should have your eyes and teeth checked.  Stay up to date on all vaccines. This information is not intended to replace advice given to you by your health care provider. Make sure you discuss any questions you have with your health care provider. Document Revised: 08/12/2019 Document Reviewed: 11/07/2018 Elsevier Patient  Education  2021 [employer].

## 2021-01-06 NOTE — Assessment & Plan Note (Signed)
Continue vitamin D supplementation 

## 2021-01-06 NOTE — Progress Notes (Signed)
Chief Complaint:  Todd Rogers is a 60 y.o. male who presents today for his annual comprehensive physical exam.    Assessment/Plan:  Chronic Problems Addressed Today: Elevated blood pressure reading At goal today.Continue home monitoring goal 140/90 or lower. Had labs done recently - he will sent them to me via mychart.   Low vitamin D level Continue vitamin D supplementation.  Elevated PSA Follows with urology.  Body mass index is 26.4 kg/m. / Overweight  BMI Metric Follow Up - 01/06/21 1459      BMI Metric Follow Up-Please document annually   BMI Metric Follow Up Education provided           Preventative Healthcare: Recently had labs done.  Follows with urology for PSA/prostate screening.  Had colonoscopy in 2015 which was normal and was told repeat in 10 years.  He will be due for next colonoscopy in 3 years.  Patient Counseling(The following topics were reviewed and/or handout was given):  -Nutrition: Stressed importance of moderation in sodium/caffeine intake, saturated fat and cholesterol, caloric balance, sufficient intake of fresh fruits, vegetables, and fiber.  -Stressed the importance of regular exercise.   -Substance Abuse: Discussed cessation/primary prevention of tobacco, alcohol, or other drug use; driving or other dangerous activities under the influence; availability of treatment for abuse.   -Injury prevention: Discussed safety belts, safety helmets, smoke detector, smoking near bedding or upholstery.   -Sexuality: Discussed sexually transmitted diseases, partner selection, use of condoms, avoidance of unintended pregnancy and contraceptive alternatives.   -Dental health: Discussed importance of regular tooth brushing, flossing, and dental visits.  -Health maintenance and immunizations reviewed. Please refer to Health maintenance section.  Return to care in 1 year for next preventative visit.     Subjective:  HPI:  He has no acute complaints today.    Lifestyle Diet: Balanced. Plenty of fruits and vegetables.  Exercise: Weight Training and cardio. Busy with work.   Depression screen PHQ 2/9 03/10/2020  Decreased Interest 0  Down, Depressed, Hopeless 0  PHQ - 2 Score 0    Health Maintenance Due  Topic Date Due  . Hepatitis C Screening  Never done  . HIV Screening  Never done     ROS: Per HPI, otherwise a complete review of systems was negative.   PMH:  The following were reviewed and entered/updated in epic: Past Medical History:  Diagnosis Date  . Allergy    Patient Active Problem List   Diagnosis Date Noted  . Low vitamin D level 02/26/2019  . Elevated blood pressure reading 02/26/2019  . Elevated PSA 07/23/2018  . Spermatocele of epididymis, multiple 07/23/2018   Past Surgical History:  Procedure Laterality Date  . HAND SURGERY  2009   patient stated he cut his hand, and part of a bone had to be removed.  Marland Kitchen SPERMATOCELECTOMY Bilateral 09/24/2018   Procedure: SPERMATOCELECTOMY;  Surgeon: Bjorn Pippin, MD;  Location: [facility];  Service: Urology;  Laterality: Bilateral;  . WISDOM TOOTH EXTRACTION      Family History  Problem Relation Age of Onset  . Hypertension Mother   . Diabetes Father   . Cancer Daughter        breast    Medications- reviewed and updated Current Outpatient Medications  Medication Sig Dispense Refill  . ACAI BERRY PO Take by mouth.    . Ascorbic Acid (VITAMIN C) 100 MG tablet Take 100 mg by mouth daily.    . cholecalciferol (VITAMIN D3) 25 MCG (1000  UT) tablet Take 1,000 Units by mouth daily.    . diclofenac (VOLTAREN) 75 MG EC tablet Take 1 tablet (75 mg total) by mouth 2 (two) times daily. 30 tablet 0  . loratadine (CLARITIN) 10 MG tablet Take 10 mg by mouth as needed for allergies.    . Multiple Vitamin (MULTIVITAMIN) tablet Take 1 tablet by mouth daily.    Marland Kitchen zinc gluconate 50 MG tablet Take 50 mg by mouth daily.     No current facility-administered medications  for this visit.    Allergies-reviewed and updated No Known Allergies  Social History   Socioeconomic History  . Marital status: Married    Spouse name: Not on file  . Number of children: Not on file  . Years of education: Not on file  . Highest education level: Not on file  Occupational History  . Not on file  Tobacco Use  . Smoking status: Never Smoker  . Smokeless tobacco: Never Used  Vaping Use  . Vaping Use: Never used  Substance and Sexual Activity  . Alcohol use: No  . Drug use: No  . Sexual activity: Yes  Other Topics Concern  . Not on file  Social History Narrative  . Not on file   Social Determinants of Health   Financial Resource Strain: Not on file  Food Insecurity: Not on file  Transportation Needs: Not on file  Physical Activity: Not on file  Stress: Not on file  Social Connections: Not on file        Objective:  Physical Exam: BP 136/88   Pulse 85   Temp 97.6 F (36.4 C) (Temporal)   Ht 6\' 2"  (1.88 m)   Wt 205 lb 9.6 oz (93.3 kg)   SpO2 99%   BMI 26.40 kg/m   Body mass index is 26.4 kg/m. Wt Readings from Last 3 Encounters:  01/06/21 205 lb 9.6 oz (93.3 kg)  12/23/20 202 lb (91.6 kg)  03/16/20 200 lb 12.8 oz (91.1 kg)   Gen: NAD, resting comfortably HEENT: TMs normal bilaterally. OP clear. No thyromegaly noted.  CV: RRR with no murmurs appreciated Pulm: NWOB, CTAB with no crackles, wheezes, or rhonchi GI: Normal bowel sounds present. Soft, Nontender, Nondistended. MSK: no edema, cyanosis, or clubbing noted Skin: warm, dry Neuro: CN2-12 grossly intact. Strength 5/5 in upper and lower extremities. Reflexes symmetric and intact bilaterally.  Psych: Normal affect and thought content     Larayah Clute M. 03/18/20, MD 01/06/2021 3:00 PM

## 2021-01-06 NOTE — Assessment & Plan Note (Signed)
Follows with urology

## 2021-01-06 NOTE — Assessment & Plan Note (Signed)
At goal today.Continue home monitoring goal 140/90 or lower. Had labs done recently - he will sent them to me via mychart.

## 2021-01-13 ENCOUNTER — Encounter: Payer: 59 | Admitting: Family Medicine

## 2021-10-11 LAB — CBC: RBC: 5.67 — AB (ref 3.87–5.11)

## 2021-10-11 LAB — BASIC METABOLIC PANEL
BUN: 13 (ref 4–21)
CO2: 28 — AB (ref 13–22)
Chloride: 105 (ref 99–108)
Creatinine: 1.1 (ref 0.6–1.3)
Glucose: 90
Potassium: 4.3 (ref 3.4–5.3)
Sodium: 143 (ref 137–147)

## 2021-10-11 LAB — HEPATIC FUNCTION PANEL
ALT: 22 (ref 10–40)
AST: 22 (ref 14–40)
Alkaline Phosphatase: 93 (ref 25–125)
Bilirubin, Total: 0.6

## 2021-10-11 LAB — LIPID PANEL
Cholesterol: 190 (ref 0–200)
HDL: 69 (ref 35–70)
LDL Cholesterol: 106
LDl/HDL Ratio: 2.8
Triglycerides: 67 (ref 40–160)

## 2021-10-11 LAB — COMPREHENSIVE METABOLIC PANEL
Albumin: 4.5 (ref 3.5–5.0)
Calcium: 9.4 (ref 8.7–10.7)
Globulin: 2.7
eGFR: 79

## 2021-10-11 LAB — CBC AND DIFFERENTIAL
HCT: 48 (ref 41–53)
Hemoglobin: 16.3 (ref 13.5–17.5)
Platelets: 206 (ref 150–399)
WBC: 6

## 2021-10-11 LAB — PSA: PSA: 1.52

## 2021-12-26 ENCOUNTER — Telehealth: Payer: Self-pay | Admitting: Family Medicine

## 2021-12-26 NOTE — Telephone Encounter (Signed)
Pt has an appt with [name] on 12/27/21 at 8am  Patient Name: Todd Rogers Gender: Male DOB: [birth year] Age: 60 Y 11 M 17 D Return Phone Number: [phone] (Primary) Address: City/ State/ Zip: [place]  [address] Client [facility] - [facility], [facility] Provider Jacquiline Doe- MD Contact Type Call Who Is Calling Patient / Member / Family / Caregiver Call Type Triage / Clinical Relationship To Patient Self Return Phone Number [phone] (Primary) Chief Complaint Abdominal Pain Reason for Call Symptomatic / Request for Health Information Initial Comment Past few weeks been having pain abdominal pain when sitting and at times pain when having a BM, does have some gas as well. does have appt tomorrow Translation No Nurse Assessment Nurse: Burman Blacksmith, RN, Kathlene November Date/Time (Eastern Time): 12/26/2021 9:56:03 AM Confirm and document reason for call. If symptomatic, describe symptoms. ---Caller states: Past few weeks been having pain abdominal pain when sitting and right before having a BM. Denies any other s/s Does the patient have any new or worsening symptoms? ---Yes Will a triage be completed? ---Yes Related visit to physician within the last 2 weeks? ---No Does the PT have any chronic conditions? (i.e. diabetes, asthma, this includes High risk factors for pregnancy, etc.) ---No Is this a behavioral health or substance abuse call? ---No Guidelines Guideline Title Affirmed Question Affirmed Notes Nurse Date/Time (Eastern Time) Abdominal Pain - Male [1] MODERATE pain (e.g., interferes with normal activities) AND [2] pain comes and goes (cramps) AND [3] present > 24 hours (Exception: pain with Emch, RN, Kathlene November 12/26/2021 9:57:53 AM Guidelines Guideline Title Affirmed Question Affirmed Notes Nurse Date/Time Lamount Cohen Time) Vomiting or Diarrhea - see that Guideline) Disp. Time Lamount Cohen Time)  Disposition Final User 12/26/2021 10:01:38 AM See PCP within 24 Hours Yes Emch, RN, Rosalita Levan Disagree/Comply Comply Caller Understands Yes PreDisposition Call Doctor Care Advice Given Per Guideline SEE PCP WITHIN 24 HOURS: * IF OFFICE WILL BE OPEN: You need to be examined within the next 24 hours. Call your doctor (or NP/PA) when the office opens and make an appointment. CRAMPS: CALL BACK IF: * Severe pain lasts over 1 hour * Constant pain lasts over 2 hours * You become worse CARE ADVICE given per Abdominal Pain, Male (Adult) guideline. Referrals REFERRED TO PCP OFFICE

## 2021-12-26 NOTE — Telephone Encounter (Signed)
See note

## 2021-12-27 ENCOUNTER — Encounter: Payer: Self-pay | Admitting: Family Medicine

## 2021-12-27 ENCOUNTER — Other Ambulatory Visit: Payer: Self-pay

## 2021-12-27 ENCOUNTER — Ambulatory Visit: Payer: 59 | Admitting: Family Medicine

## 2021-12-27 VITALS — BP 132/92 | HR 89 | Ht 74.0 in | Wt 202.2 lb

## 2021-12-27 DIAGNOSIS — R030 Elevated blood-pressure reading, without diagnosis of hypertension: Secondary | ICD-10-CM | POA: Diagnosis not present

## 2021-12-27 DIAGNOSIS — K429 Umbilical hernia without obstruction or gangrene: Secondary | ICD-10-CM | POA: Diagnosis not present

## 2021-12-27 DIAGNOSIS — R7989 Other specified abnormal findings of blood chemistry: Secondary | ICD-10-CM

## 2021-12-27 NOTE — Progress Notes (Signed)
° °  Todd Rogers is a 61 y.o. male who presents today for an office visit.  Assessment/Plan:  New/Acute Problems: Abdominal pain Has umbilical hernia that is tender on palpation.  This is likely source of his pain.  May be having bowel spasms as well.  Otherwise reassuring exam without any peritoneal signs.  No red flags otherwise.  We discussed imaging with CT scan versus surgical referral.  He would like to continue with watchful waiting for now.  If symptoms persist over the next couple of weeks or if symptoms worsen I to get CT scan.  We discussed reasons to return to care.  He will follow-up in a few weeks for CPE.  Chronic Problems Addressed Today: Elevated blood pressure reading Slightly above goal per JNC 8.  We will continue home monitoring.  Low vitamin D level We will recheck vitamin D when he comes back in for CPE.     Subjective:  HPI:  He is here with abdominal cramps. This has been going on for a week. Located on upper and lower abdominal. He described the pain as "sharp pain". He notes he was sitting at home when he felt this pain. He had urge to bowel movements. This happened 3 times for the past week. Pain lasted for few seconds and then resolved. He has not tried any treatment. His bowel movements were normal. He notes he has not change his diet. He is trying to eat healthy. He does have hernia which is tender to the touch. No constipation or diarrhea. Denies blood in stool. No n/v,  fever, or chills. No heart burn or reflux.        Objective:  Physical Exam: BP (!) 132/92    Pulse 89    Ht 6\' 2"  (1.88 m)    Wt 202 lb 3.2 oz (91.7 kg)    SpO2 97%    BMI 25.96 kg/m   Gen: No acute distress, resting comfortably CV: Regular rate and rhythm with no murmurs appreciated Pulm: Normal work of breathing, clear to auscultation bilaterally with no crackles, wheezes, or rhonchi GU: Medical hernia present.  Bowel sounds present.  Umbilical hernia tender to palpation.  No  rebound or guarding.  Neuro: Grossly normal, moves all extremities Psych: Normal affect and thought content       I,Savera Zaman,acting as a scribe for , MD.,have documented all relevant documentation on the behalf of Jacquiline Doe, MD,as directed by  Jacquiline Doe, MD while in the presence of Jacquiline Doe, MD.   I, Jacquiline Doe, MD, have reviewed all documentation for this visit. The documentation on 12/27/21 for the exam, diagnosis, procedures, and orders are all accurate and complete.   Time Spent: 20 minutes of total time was spent on the date of the encounter performing the following actions: chart review prior to seeing the patient, obtaining history, performing a medically necessary exam, counseling on the treatment plan, placing orders, and documenting in our EHR.   12/29/21. Katina Degree, MD 12/27/2021 8:52 AM

## 2021-12-27 NOTE — Assessment & Plan Note (Signed)
We will recheck vitamin D when he comes back in for CPE.

## 2021-12-27 NOTE — Assessment & Plan Note (Signed)
Slightly above goal per JNC 8.  We will continue home monitoring.

## 2021-12-27 NOTE — Patient Instructions (Signed)
It was very nice to see you today!  You have an umbilical hernia.  I think this is probably causing your pain.  We can get a CT scan to further evaluate before we send you to see a surgeon.  Please let me know if your symptoms worsen over the next few weeks.  Take care, Dr [name]  PLEASE NOTE:  If you had any lab tests please let us know if you have not heard back within a few days. You may see your results on mychart before we have a chance to review them but we will give you a call once they are reviewed by [place]. If we ordered any referrals today, please let us know if you have not heard from their office within the next week.   Please try these tips to maintain a healthy lifestyle:  Eat at least 3 REAL meals and 1-2 snacks per day.  Aim for no more than 5 hours between eating.  If you eat breakfast, please do so within one hour of getting up.   Each meal should contain half fruits/vegetables, one quarter protein, and one quarter carbs (no bigger than a computer mouse)  Cut down on sweet beverages. This includes juice, soda, and sweet tea.   Drink at least 1 glass of water with each meal and aim for at least 8 glasses per day  Exercise at least 150 minutes every week.    Umbilical Hernia, Adult A hernia is a bulge of tissue that pushes through an opening between muscles. An umbilical hernia happens in the abdomen, near the belly button (umbilicus). The hernia may contain tissues from the small intestine, large intestine, or fatty tissue covering the intestines. Umbilical hernias in adults tend to get worse over time, and they require surgical treatment. There are different types of umbilical hernias, including: Indirect hernia. This type is located just above or below the umbilicus. It is the most common type of umbilical hernia in adults. Direct hernia. This type forms through an opening formed by the umbilicus. Reducible hernia. This type of hernia comes and goes. It may be visible only  when you strain, lift something heavy, or cough. This type of hernia can be pushed back into the abdomen (reduced). Incarcerated hernia. This type traps abdominal tissue inside the hernia. This type of hernia cannot be reduced. Strangulated hernia. This type of hernia cuts off blood flow to the tissues inside the hernia. The tissues can start to die if this happens. This type of hernia requires emergency treatment. What are the causes? An umbilical hernia happens when tissue inside the abdomen presses on a weak area of the abdominal muscles. What increases the risk? You may have a greater risk of this condition if you: Are obese. Have had several pregnancies. Have a buildup of fluid inside your abdomen. Have had surgery that weakens the abdominal muscles. What are the signs or symptoms? The main symptom of this condition is a painless bulge at or near the belly button. A reducible hernia may be visible only when you strain, lift something heavy, or cough. Other symptoms may include: Dull pain. A feeling of pressure. Symptoms of a strangulated hernia may include: Pain that gets increasingly worse. Nausea and vomiting. Pain when pressing on the hernia. Skin over the hernia becoming red or purple. Constipation. Blood in the stool. How is this diagnosed? This condition may be diagnosed based on: A physical exam. You may be asked to cough or strain while standing. These  actions increase the pressure inside your abdomen and can force the hernia through the opening in your muscles. Your health care provider may try to reduce the hernia by pressing on it. Your symptoms and medical history. How is this treated? Surgery is the only treatment for an umbilical hernia. Surgery for a strangulated hernia is done as soon as possible. If you have a small hernia that is not incarcerated, you may need to lose weight before having surgery. Follow these instructions at home: Lose weight, if told by your  health care provider. Do not try to push the hernia back in. Watch your hernia for any changes in color or size. Tell your health care provider if any changes occur. You may need to avoid activities that increase pressure on your hernia. Do not lift anything that is heavier than 10 lb (4.5 kg), or the limit that you are told, until your health care provider says that it is safe. Take over-the-counter and prescription medicines only as told by your health care provider. Keep all follow-up visits. This is important. Contact a health care provider if: Your hernia gets larger. Your hernia becomes painful. Get help right away if: You develop sudden, severe pain near the area of your hernia. You have pain as well as nausea or vomiting. You have pain and the skin over your hernia changes color. You develop a fever or chills. Summary A hernia is a bulge of tissue that pushes through an opening between muscles. An umbilical hernia happens near the belly button. Surgery is the only treatment for an umbilical hernia. Do not try to push your hernia back in. Keep all follow-up visits. This is important. This information is not intended to replace advice given to you by your health care provider. Make sure you discuss any questions you have with your health care provider. Document Revised: 06/21/2020 Document Reviewed: 06/21/2020 Elsevier Patient Education  2022 [employer].

## 2022-01-13 ENCOUNTER — Encounter: Payer: 59 | Admitting: Family Medicine

## 2022-01-17 ENCOUNTER — Encounter: Payer: 59 | Admitting: Family Medicine

## 2022-01-20 ENCOUNTER — Ambulatory Visit (INDEPENDENT_AMBULATORY_CARE_PROVIDER_SITE_OTHER): Payer: 59 | Admitting: Family Medicine

## 2022-01-20 ENCOUNTER — Encounter: Payer: Self-pay | Admitting: Family Medicine

## 2022-01-20 ENCOUNTER — Other Ambulatory Visit: Payer: Self-pay

## 2022-01-20 VITALS — BP 142/82 | HR 85 | Temp 98.5°F | Ht 74.0 in | Wt 195.4 lb

## 2022-01-20 DIAGNOSIS — Z23 Encounter for immunization: Secondary | ICD-10-CM | POA: Diagnosis not present

## 2022-01-20 DIAGNOSIS — J302 Other seasonal allergic rhinitis: Secondary | ICD-10-CM | POA: Diagnosis not present

## 2022-01-20 DIAGNOSIS — Z13220 Encounter for screening for lipoid disorders: Secondary | ICD-10-CM

## 2022-01-20 DIAGNOSIS — R739 Hyperglycemia, unspecified: Secondary | ICD-10-CM

## 2022-01-20 DIAGNOSIS — K429 Umbilical hernia without obstruction or gangrene: Secondary | ICD-10-CM | POA: Diagnosis not present

## 2022-01-20 DIAGNOSIS — R7989 Other specified abnormal findings of blood chemistry: Secondary | ICD-10-CM | POA: Diagnosis not present

## 2022-01-20 DIAGNOSIS — Z0000 Encounter for general adult medical examination without abnormal findings: Secondary | ICD-10-CM

## 2022-01-20 DIAGNOSIS — R972 Elevated prostate specific antigen [PSA]: Secondary | ICD-10-CM

## 2022-01-20 DIAGNOSIS — Z0001 Encounter for general adult medical examination with abnormal findings: Secondary | ICD-10-CM | POA: Diagnosis not present

## 2022-01-20 DIAGNOSIS — R030 Elevated blood-pressure reading, without diagnosis of hypertension: Secondary | ICD-10-CM

## 2022-01-20 LAB — CBC
HCT: 44.2 % (ref 39.0–52.0)
Hemoglobin: 14.9 g/dL (ref 13.0–17.0)
MCHC: 33.8 g/dL (ref 30.0–36.0)
MCV: 85.1 fl (ref 78.0–100.0)
Platelets: 209 10*3/uL (ref 150.0–400.0)
RBC: 5.19 Mil/uL (ref 4.22–5.81)
RDW: 15.1 % (ref 11.5–15.5)
WBC: 5.6 10*3/uL (ref 4.0–10.5)

## 2022-01-20 LAB — COMPREHENSIVE METABOLIC PANEL
ALT: 26 U/L (ref 0–53)
AST: 27 U/L (ref 0–37)
Albumin: 4.2 g/dL (ref 3.5–5.2)
Alkaline Phosphatase: 98 U/L (ref 39–117)
BUN: 18 mg/dL (ref 6–23)
CO2: 30 mEq/L (ref 19–32)
Calcium: 9.5 mg/dL (ref 8.4–10.5)
Chloride: 107 mEq/L (ref 96–112)
Creatinine, Ser: 1.21 mg/dL (ref 0.40–1.50)
GFR: 65.76 mL/min (ref >60.00)
Glucose, Bld: 93 mg/dL (ref 70–99)
Potassium: 4 mEq/L (ref 3.5–5.1)
Sodium: 142 mEq/L (ref 135–145)
Total Bilirubin: 0.5 mg/dL (ref 0.2–1.2)
Total Protein: 6.6 g/dL (ref 6.0–8.3)

## 2022-01-20 LAB — LIPID PANEL
Cholesterol: 173 mg/dL (ref 0–200)
HDL: 59.5 mg/dL (ref >39.00)
LDL Cholesterol: 85 mg/dL (ref 0–99)
NonHDL: 113.39
Total CHOL/HDL Ratio: 3
Triglycerides: 142 mg/dL (ref 0.0–149.0)
VLDL: 28.4 mg/dL (ref 0.0–40.0)

## 2022-01-20 LAB — HEMOGLOBIN A1C: Hgb A1c MFr Bld: 5.5 % (ref 4.6–6.5)

## 2022-01-20 LAB — TSH: TSH: 0.89 u[IU]/mL (ref 0.35–5.50)

## 2022-01-20 LAB — PSA: PSA: 3.77 ng/mL (ref 0.10–4.00)

## 2022-01-20 LAB — LIPASE: Lipase: 23 U/L (ref 11.0–59.0)

## 2022-01-20 LAB — VITAMIN D 25 HYDROXY (VIT D DEFICIENCY, FRACTURES): VITD: 55.24 ng/mL (ref 30.00–100.00)

## 2022-01-20 NOTE — Progress Notes (Signed)
Chief Complaint:  Todd Rogers is a 61 y.o. male who presents today for his annual comprehensive physical exam.    Assessment/Plan:  Chronic Problems Addressed Today: Seasonal allergies Claritin does not seem to be as effective in the past. Recommended zyrtec orxyzal.   Low vitamin D level Check vit D.   Elevated PSA Follows with urology.  We will check PSA.  Preventative Healthcare: UTD on colon cancer screening. Will check PSA. Gets UA done via urology. Will give tdap and shingrix today.  Check cardiac CT scan.  Patient Counseling(The following topics were reviewed and/or handout was given):  -Nutrition: Stressed importance of moderation in sodium/caffeine intake, saturated fat and cholesterol, caloric balance, sufficient intake of fresh fruits, vegetables, and fiber.  -Stressed the importance of regular exercise.   -Substance Abuse: Discussed cessation/primary prevention of tobacco, alcohol, or other drug use; driving or other dangerous activities under the influence; availability of treatment for abuse.   -Injury prevention: Discussed safety belts, safety helmets, smoke detector, smoking near bedding or upholstery.   -Sexuality: Discussed sexually transmitted diseases, partner selection, use of condoms, avoidance of unintended pregnancy and contraceptive alternatives.   -Dental health: Discussed importance of regular tooth brushing, flossing, and dental visits.  -Health maintenance and immunizations reviewed. Please refer to Health maintenance section.  Return to care in 1 year for next preventative visit.     Subjective:  HPI:  He has no acute complaints today.   Lifestyle Diet: Balanced. Plenty of fruits and vegetables.  Exercise: Going to the gym.   Depression screen [facility] 2/9 01/20/2022  Decreased Interest 0  Down, Depressed, Hopeless 0  PHQ - 2 Score 0    Health Maintenance Due  Topic Date Due   Zoster Vaccines- Shingrix (1 of 2) Never done   TETANUS/TDAP   11/16/2021     ROS: Per HPI, otherwise a complete review of systems was negative.   PMH:  The following were reviewed and entered/updated in epic: Past Medical History:  Diagnosis Date   Allergy    Patient Active Problem List   Diagnosis Date Noted   Seasonal allergies 01/20/2022   Low vitamin D level 02/26/2019   Elevated blood pressure reading 02/26/2019   Elevated PSA 07/23/2018   Spermatocele of epididymis, multiple 07/23/2018   Past Surgical History:  Procedure Laterality Date   HAND SURGERY  2009   patient stated he cut his hand, and part of a bone had to be removed.   SPERMATOCELECTOMY Bilateral 09/24/2018   Procedure: SPERMATOCELECTOMY;  Surgeon: Bjorn Pippin, MD;  Location: [facility];  Service: Urology;  Laterality: Bilateral;   WISDOM TOOTH EXTRACTION      Family History  Problem Relation Age of Onset   Hypertension Mother    Diabetes Father    Cancer Daughter        breast    Medications- reviewed and updated Current Outpatient Medications  Medication Sig Dispense Refill   ACAI BERRY PO Take by mouth.     Ascorbic Acid (VITAMIN C) 100 MG tablet Take 100 mg by mouth daily.     cholecalciferol (VITAMIN D3) 25 MCG (1000 UT) tablet Take 1,000 Units by mouth daily.     loratadine (CLARITIN) 10 MG tablet Take 10 mg by mouth as needed for allergies.     Multiple Vitamin (MULTIVITAMIN) tablet Take 1 tablet by mouth daily.     zinc gluconate 50 MG tablet Take 50 mg by mouth daily.     No current  facility-administered medications for this visit.    Allergies-reviewed and updated No Known Allergies  Social History   Socioeconomic History   Marital status: Married    Spouse name: Not on file   Number of children: Not on file   Years of education: Not on file   Highest education level: Not on file  Occupational History   Not on file  Tobacco Use   Smoking status: Never   Smokeless tobacco: Never  Vaping Use   Vaping Use: Never used   Substance and Sexual Activity   Alcohol use: No   Drug use: No   Sexual activity: Yes  Other Topics Concern   Not on file  Social History Narrative   Not on file   Social Determinants of Health   Financial Resource Strain: Not on file  Food Insecurity: Not on file  Transportation Needs: Not on file  Physical Activity: Not on file  Stress: Not on file  Social Connections: Not on file        Objective:  Physical Exam: BP (!) 142/82 (BP Location: Right Arm)    Pulse 85    Temp 98.5 F (36.9 C) (Temporal)    Ht 6\' 2"  (1.88 m)    Wt 195 lb 6.4 oz (88.6 kg)    SpO2 98%    BMI 25.09 kg/m   Body mass index is 25.09 kg/m. Wt Readings from Last 3 Encounters:  01/20/22 195 lb 6.4 oz (88.6 kg)  12/27/21 202 lb 3.2 oz (91.7 kg)  01/06/21 205 lb 9.6 oz (93.3 kg)   Gen: NAD, resting comfortably HEENT: TMs normal bilaterally. OP clear. No thyromegaly noted.  CV: RRR with no murmurs appreciated Pulm: NWOB, CTAB with no crackles, wheezes, or rhonchi GI: Normal bowel sounds present. Soft, Nontender, Nondistended. MSK: no edema, cyanosis, or clubbing noted Skin: warm, dry Neuro: CN2-12 grossly intact. Strength 5/5 in upper and lower extremities. Reflexes symmetric and intact bilaterally.  Psych: Normal affect and thought content     Leilyn Frayre M. 03/06/21, MD 01/20/2022 1:44 PM

## 2022-01-20 NOTE — Patient Instructions (Signed)
It was very nice to see you today!  We will check blood work today.  We will get you set up for a CT scan of your heart.  We will give your vaccine today.  Please continue to work on diet and exercise.  Please try using either Xyzal or Zyrtec for your allergies.  Please come back in 1 year for your next checkup.  Come back sooner if needed.  Take care, Dr [name]  PLEASE NOTE:  If you had any lab tests please let us know if you have not heard back within a few days. You may see your results on mychart before we have a chance to review them but we will give you a call once they are reviewed by [place]. If we ordered any referrals today, please let us know if you have not heard from their office within the next week.   Please try these tips to maintain a healthy lifestyle:  Eat at least 3 REAL meals and 1-2 snacks per day.  Aim for no more than 5 hours between eating.  If you eat breakfast, please do so within one hour of getting up.   Each meal should contain half fruits/vegetables, one quarter protein, and one quarter carbs (no bigger than a computer mouse)  Cut down on sweet beverages. This includes juice, soda, and sweet tea.   Drink at least 1 glass of water with each meal and aim for at least 8 glasses per day  Exercise at least 150 minutes every week.    Preventive Care 96-26 Years Old, Male Preventive care refers to lifestyle choices and visits with your health care provider that can promote health and wellness. Preventive care visits are also called wellness exams. What can I expect for my preventive care visit? Counseling During your preventive care visit, your health care provider may ask about your: Medical history, including: Past medical problems. Family medical history. Current health, including: Emotional well-being. Home life and relationship well-being. Sexual activity. Lifestyle, including: Alcohol, nicotine or tobacco, and drug use. Access to  firearms. Diet, exercise, and sleep habits. Safety issues such as seatbelt and bike helmet use. Sunscreen use. Work and work Astronomer. Physical exam Your health care provider will check your: Height and weight. These may be used to calculate your BMI (body mass index). BMI is a measurement that tells if you are at a healthy weight. Waist circumference. This measures the distance around your waistline. This measurement also tells if you are at a healthy weight and may help predict your risk of certain diseases, such as type 2 diabetes and high blood pressure. Heart rate and blood pressure. Body temperature. Skin for abnormal spots. What immunizations do I need? Vaccines are usually given at various ages, according to a schedule. Your health care provider will recommend vaccines for you based on your age, medical history, and lifestyle or other factors, such as travel or where you work. What tests do I need? Screening Your health care provider may recommend screening tests for certain conditions. This may include: Lipid and cholesterol levels. Diabetes screening. This is done by checking your blood sugar (glucose) after you have not eaten for a while (fasting). Hepatitis B test. Hepatitis C test. HIV (human immunodeficiency virus) test. STI (sexually transmitted infection) testing, if you are at risk. Lung cancer screening. Prostate cancer screening. Colorectal cancer screening. Talk with your health care provider about your test results, treatment options, and if necessary, the need for more tests. Follow these instructions  at home: Eating and drinking  Eat a diet that includes fresh fruits and vegetables, whole grains, lean protein, and low-fat dairy products. Take vitamin and mineral supplements as recommended by your health care provider. Do not drink alcohol if your health care provider tells you not to drink. If you drink alcohol: Limit how much you have to 0-2 drinks a  day. Know how much alcohol is in your drink. In the [place] one drink equals one 12 oz bottle of beer (355 mL), one 5 oz glass of wine (148 mL), or one 1 oz glass of hard liquor (44 mL). Lifestyle Brush your teeth every morning and night with fluoride toothpaste. Floss one time each day. Exercise for at least 30 minutes 5 or more days each week. Do not use any products that contain nicotine or tobacco. These products include cigarettes, chewing tobacco, and vaping devices, such as e-cigarettes. If you need help quitting, ask your health care provider. Do not use drugs. If you are sexually active, practice safe sex. Use a condom or other form of protection to prevent STIs. Take aspirin only as told by your health care provider. Make sure that you understand how much to take and what form to take. Work with your health care provider to find out whether it is safe and beneficial for you to take aspirin daily. Find healthy ways to manage stress, such as: Meditation, yoga, or listening to music. Journaling. Talking to a trusted person. Spending time with friends and family. Minimize exposure to UV radiation to reduce your risk of skin cancer. Safety Always wear your seat belt while driving or riding in a vehicle. Do not drive: If you have been drinking alcohol. Do not ride with someone who has been drinking. When you are tired or distracted. While texting. If you have been using any mind-altering substances or drugs. Wear a helmet and other protective equipment during sports activities. If you have firearms in your house, make sure you follow all gun safety procedures. What's next? Go to your health care provider once a year for an annual wellness visit. Ask your health care provider how often you should have your eyes and teeth checked. Stay up to date on all vaccines. This information is not intended to replace advice given to you by your health care provider. Make sure you discuss any  questions you have with your health care provider. Document Revised: 05/11/2021 Document Reviewed: 05/11/2021 Elsevier Patient Education  2022 [employer].

## 2022-01-20 NOTE — Assessment & Plan Note (Signed)
Check vit D 

## 2022-01-20 NOTE — Assessment & Plan Note (Addendum)
Claritin does not seem to be as effective in the past. Recommended zyrtec orxyzal.

## 2022-01-20 NOTE — Assessment & Plan Note (Signed)
Follows with urology.  We will check PSA.

## 2022-01-23 NOTE — Progress Notes (Signed)
Please inform patient of the following:  Labs are all stable. Do not need to make any changes to his treatment plan at this time. WE can recheck in a year.

## 2022-01-25 ENCOUNTER — Encounter: Payer: Self-pay | Admitting: Family Medicine

## 2022-03-09 ENCOUNTER — Ambulatory Visit (INDEPENDENT_AMBULATORY_CARE_PROVIDER_SITE_OTHER)
Admission: RE | Admit: 2022-03-09 | Discharge: 2022-03-09 | Disposition: A | Discharge status: Home / Self Care | Payer: Self-pay | Source: Ambulatory Visit | Attending: Family Medicine | Admitting: Family Medicine

## 2022-03-09 ENCOUNTER — Other Ambulatory Visit: Payer: 59

## 2022-03-09 DIAGNOSIS — R030 Elevated blood-pressure reading, without diagnosis of hypertension: Secondary | ICD-10-CM

## 2022-03-09 DIAGNOSIS — Z0001 Encounter for general adult medical examination with abnormal findings: Secondary | ICD-10-CM

## 2022-03-10 ENCOUNTER — Other Ambulatory Visit: Payer: 59

## 2022-03-14 NOTE — Progress Notes (Signed)
Can we have patient schedule an appointment to discuss his CT scan?  It is nothing urgent but I would like to have a discussion regarding pros and cons of further testing and next steps. ? ?Katina Degree. [name], MD ?03/14/2022 4:17 PM  ?

## 2022-03-15 ENCOUNTER — Telehealth: Payer: Self-pay | Admitting: Family Medicine

## 2022-03-15 NOTE — Telephone Encounter (Signed)
Ok to change to virtual.  

## 2022-03-15 NOTE — Telephone Encounter (Signed)
For the 4/20 appt, pt has to work. Can this visit be switched to a virtual/MyChart appt? ?

## 2022-03-16 ENCOUNTER — Telehealth (INDEPENDENT_AMBULATORY_CARE_PROVIDER_SITE_OTHER): Payer: 59 | Admitting: Family Medicine

## 2022-03-16 ENCOUNTER — Encounter: Payer: Self-pay | Admitting: Family Medicine

## 2022-03-16 VITALS — Ht 74.0 in | Wt 200.0 lb

## 2022-03-16 DIAGNOSIS — R911 Solitary pulmonary nodule: Secondary | ICD-10-CM | POA: Diagnosis not present

## 2022-03-16 DIAGNOSIS — R972 Elevated prostate specific antigen [PSA]: Secondary | ICD-10-CM | POA: Diagnosis not present

## 2022-03-16 DIAGNOSIS — I2510 Atherosclerotic heart disease of native coronary artery without angina pectoris: Secondary | ICD-10-CM

## 2022-03-16 DIAGNOSIS — I2584 Coronary atherosclerosis due to calcified coronary lesion: Secondary | ICD-10-CM | POA: Diagnosis not present

## 2022-03-16 NOTE — Progress Notes (Signed)
? ?  Todd Rogers is a 61 y.o. male who presents today for a virtual office visit. ? ?Assessment/Plan:  ?Chronic Problems Addressed Today: ?Coronary artery calcification ?Patient found to have slight calcification in left anterior descending artery.  His 10-year ASCVD is 8%.  Last LDL was 85.  We discussed initiation of statin however given his overall low ASCVD and well-controlled LDL he would like to defer for now.  We will recheck lipids again next year with goal of keeping LDL less than 100. ? ?We discussed dietary changes including high-fiber diet. ? ?Pulmonary nodule ?Patient incidentally found to have a 4 mm nodule in lingula.  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  We can repeat noncontrast CT scan in 2024. ? ?Elevated PSA ?Most recent PSA 3.77.  We will recheck again in 6 to 12 months. ? ? ?  ?Subjective:  ?HPI: ? ?See A/p for status of chronic conditions.  Patient is here to discuss his most recent CT scan. ? ?   ?  ?Objective/Observations  ?Physical Exam: ?Gen: NAD, resting comfortably ?Pulm: Normal work of breathing ?Neuro: Grossly normal, moves all extremities ?Psych: Normal affect and thought content ? ?Virtual Visit via Video  ? ?I connected with Orland Penman on 03/16/22 at  9:20 AM EDT by a video enabled telemedicine application and verified that I am speaking with the correct person using two identifiers. The limitations of evaluation and management by telemedicine and the availability of in person appointments were discussed. The patient expressed understanding and agreed to proceed.  ? ?Patient location: Home ?Provider location: [facility] ?Persons participating in the virtual visit: Myself and Patient ?   ? ?Katina Degree. [name], MD ?03/16/2022 10:12 AM  ?

## 2022-03-16 NOTE — Assessment & Plan Note (Addendum)
Patient found to have slight calcification in left anterior descending artery.  His 10-year ASCVD is 8%.  Last LDL was 85.  We discussed initiation of statin however given his overall low ASCVD and well-controlled LDL he would like to defer for now.  We will recheck lipids again next year with goal of keeping LDL less than 100. ? ?We discussed dietary changes including high-fiber diet. ?

## 2022-03-16 NOTE — Assessment & Plan Note (Signed)
Most recent PSA 3.77.  We will recheck again in 6 to 12 months. ?

## 2022-03-16 NOTE — Assessment & Plan Note (Signed)
Patient incidentally found to have a 4 mm nodule in lingula.  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  We can repeat noncontrast CT scan in 2024. ?

## 2022-03-29 ENCOUNTER — Ambulatory Visit (INDEPENDENT_AMBULATORY_CARE_PROVIDER_SITE_OTHER): Payer: 59

## 2022-03-29 DIAGNOSIS — Z23 Encounter for immunization: Secondary | ICD-10-CM

## 2022-03-29 NOTE — Progress Notes (Signed)
Todd Rogers 61 yr old male presents to office today for seocnd shingles vaccine per Dimas Chyle, MD. Administered SHINGRIX 0.5 mL IM right arm. Patient tolerated well.  ?

## 2022-06-27 ENCOUNTER — Encounter: Payer: Self-pay | Admitting: Family

## 2022-06-27 ENCOUNTER — Ambulatory Visit: Payer: 59 | Admitting: Family

## 2022-06-27 VITALS — BP 152/110 | HR 70 | Temp 97.9°F | Ht 74.0 in | Wt 195.4 lb

## 2022-06-27 DIAGNOSIS — R030 Elevated blood-pressure reading, without diagnosis of hypertension: Secondary | ICD-10-CM

## 2022-06-27 DIAGNOSIS — M25511 Pain in right shoulder: Secondary | ICD-10-CM

## 2022-06-27 MED ORDER — CYCLOBENZAPRINE HCL 10 MG PO TABS: 10.0000 mg | ORAL_TABLET | Freq: Every day | ORAL | 0 refills | Status: DC

## 2022-06-27 MED ORDER — DICLOFENAC SODIUM 1 % EX GEL: 2.0000 g | Freq: Four times a day (QID) | CUTANEOUS | 3 refills | Status: DC

## 2022-06-27 MED ORDER — DICLOFENAC SODIUM 75 MG PO TBEC
75.0000 mg | DELAYED_RELEASE_TABLET | Freq: Two times a day (BID) | ORAL | 1 refills | Status: DC
Start: 2022-06-27 — End: 2024-03-27

## 2022-06-27 NOTE — Patient Instructions (Signed)
It was very nice to see you today!   I have sent over an anti-inflammatory medicine, Diclofenac Sodium to take twice a day for your shoulder pain. I also sent over the same medicine in a gel to apply to your shoulder 3-4 times per day. And I sent generic Flexeril to take at bedtime to help you sleep with less pain.  Follow up with Dr. [name] for refills.  Have a great day!    PLEASE NOTE:  If you had any lab tests please let us know if you have not heard back within a few days. You may see your results on MyChart before we have a chance to review them but we will give you a call once they are reviewed by [place]. If we ordered any referrals today, please let us know if you have not heard from their office within the next week.

## 2022-06-27 NOTE — Progress Notes (Signed)
Patient ID: Todd Rogers, male    DOB: 1960/12/09, 61 y.o.   MRN: [id number]  Chief Complaint  Patient presents with   Shoulder Pain    Pt c/o right shoulder pain, and tingling that radiates up to neck and nmbness down finger tips. Has tried advil and aleve which does help it a little. Present for two weeks.     HPI: Right shoulder pain:  started about a month ago, reports good ROM, but after resting a while it starts throbbing, also reports numbness and tingling and trouble sleeping at night as he sleeps on that side usually. 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  Assessment & Plan:  1. Acute pain of right shoulder no tenderness with palpation, has good ROM, works as a Theatre stage manager and does not notice as much when working, but afterwards he reports it throbbing with paresthesias. Sending Diclofenac tabs & gel during day, and Flexeril qhs, advised on use & SE, f/u with PCP.  - diclofenac (VOLTAREN) 75 MG EC tablet; Take 1 tablet (75 mg total) by mouth 2 (two) times daily.  Dispense: 60 tablet; Refill: 1 - diclofenac Sodium (VOLTAREN) 1 % GEL; Apply 2 g topically 4 (four) times daily.  Dispense: 100 g; Refill: 3  2. Elevated blood pressure reading high today, and has had high readings in past, pt reports trying to make lifestyle changes. 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   Subjective:    Outpatient Medications Prior to Visit  Medication Sig Dispense Refill   ACAI BERRY PO Take by mouth.     Ascorbic Acid (VITAMIN C) 100 MG tablet Take 100 mg by mouth daily.     cholecalciferol (VITAMIN D3) 25 MCG (1000 UT) tablet Take 1,000 Units by mouth daily.     loratadine (CLARITIN) 10 MG tablet Take 10 mg by mouth as needed for allergies.     Multiple Vitamin (MULTIVITAMIN) tablet Take 1 tablet by mouth daily.     zinc gluconate 50  MG tablet Take 50 mg by mouth daily.     No facility-administered medications prior to visit.   Past Medical History:  Diagnosis Date   Allergy    Past Surgical History:  Procedure Laterality Date   HAND SURGERY  2009   patient stated he cut his hand, and part of a bone had to be removed.   SPERMATOCELECTOMY Bilateral 09/24/2018   Procedure: SPERMATOCELECTOMY;  Surgeon: Bjorn Pippin, MD;  [facility];  Service: Urology;  Laterality: Bilateral;   WISDOM TOOTH EXTRACTION     No Known Allergies    Objective:    Physical Exam Vitals and nursing note reviewed.  Constitutional:      General: He is not in acute distress.    Appearance: Normal appearance.  HENT:     Head: Normocephalic.  Cardiovascular:     Rate and Rhythm: Normal rate and regular rhythm.  Pulmonary:     Effort: Pulmonary effort is normal.  Musculoskeletal:     Right shoulder: No swelling or tenderness. Normal range of motion.     Cervical back: Normal range of motion.  Skin:    General: Skin is warm and dry.  Neurological:     Mental Status: He is alert and oriented to person, place, and time.  Psychiatric:  Mood and Affect: Mood normal.    BP (!) 152/110 (BP Location: Left Arm, Patient Position: Sitting, Cuff Size: Large)   Pulse 70   Temp 97.9 F (36.6 C) (Temporal)   Ht 6\' 2"  (1.88 m)   Wt 195 lb 6 oz (88.6 kg)   SpO2 99%   BMI 25.08 kg/m  Wt Readings from Last 3 Encounters:  06/27/22 195 lb 6 oz (88.6 kg)  03/16/22 200 lb (90.7 kg)  01/20/22 195 lb 6.4 oz (88.6 kg)       01/22/22, NP

## 2022-07-25 ENCOUNTER — Encounter: Payer: Self-pay | Admitting: Family Medicine

## 2022-07-25 ENCOUNTER — Ambulatory Visit: Payer: 59 | Admitting: Family Medicine

## 2022-07-25 VITALS — BP 143/97 | HR 87 | Temp 98.0°F | Ht 74.0 in | Wt 196.8 lb

## 2022-07-25 DIAGNOSIS — R030 Elevated blood-pressure reading, without diagnosis of hypertension: Secondary | ICD-10-CM | POA: Diagnosis not present

## 2022-07-25 DIAGNOSIS — M5412 Radiculopathy, cervical region: Secondary | ICD-10-CM

## 2022-07-25 MED ORDER — PREDNISONE 50 MG PO TABS: ORAL_TABLET | ORAL | 0 refills | Status: DC

## 2022-07-25 NOTE — Assessment & Plan Note (Signed)
Slightly elevated above goal per JNC 8.  He has a strong desire to avoid blood pressure medications at this point.  We discussed lifestyle modifications including low-sodium diet and regular exercise.  He will continue to monitor at home though if persistently elevated will need to be treated with antihypertensive.

## 2022-07-25 NOTE — Patient Instructions (Signed)
It was very nice to see you today!  I think you have a pinched nerve in your neck.  Please start the prednisone.  I will refer you to see physical therapy.  Please keep an eye on your blood pressure and let me know if your blood pressures persistently elevated.  Take care, Dr [name]  PLEASE NOTE:  If you had any lab tests please let us know if you have not heard back within a few days. You may see your results on mychart before we have a chance to review them but we will give you a call once they are reviewed by [place]. If we ordered any referrals today, please let us know if you have not heard from their office within the next week.   Please try these tips to maintain a healthy lifestyle:  Eat at least 3 REAL meals and 1-2 snacks per day.  Aim for no more than 5 hours between eating.  If you eat breakfast, please do so within one hour of getting up.   Each meal should contain half fruits/vegetables, one quarter protein, and one quarter carbs (no bigger than a computer mouse)  Cut down on sweet beverages. This includes juice, soda, and sweet tea.   Drink at least 1 glass of water with each meal and aim for at least 8 glasses per day  Exercise at least 150 minutes every week.

## 2022-07-25 NOTE — Assessment & Plan Note (Signed)
No red flags.  Has had modest improvement with Voltaren and Flexeril.  We will send in prednisone burst.  He has had this in the past and has done well with this.  We will also refer him to see physical therapist.  He will let me know if not improving in the next couple of weeks and we can consider imaging versus referral to sports medicine at that time.

## 2022-07-25 NOTE — Progress Notes (Signed)
   Todd Rogers is a 61 y.o. male who presents today for an office visit.  Assessment/Plan:  Chronic Problems Addressed Today: Cervical radiculopathy No red flags.  Has had modest improvement with Voltaren and Flexeril.  We will send in prednisone burst.  He has had this in the past and has done well with this.  We will also refer him to see physical therapist.  He will let me know if not improving in the next couple of weeks and we can consider imaging versus referral to sports medicine at that time.  Elevated blood pressure reading Slightly elevated above goal per JNC 8.  He has a strong desire to avoid blood pressure medications at this point.  We discussed lifestyle modifications including low-sodium diet and regular exercise.  He will continue to monitor at home though if persistently elevated will need to be treated with antihypertensive.     Subjective:  HPI:  See A/p for status of chronic conditions.    Here for follow-up for right shoulder pain.  Saw different provider in this office 2 weeks ago.  Was given Flexeril left neck.  Symptoms have improved modestly.  Still has persistent tingling from his shoulder down into his hands.  Worse with certain motions.  Worse with sleeping.  No weakness.       Objective:  Physical Exam: BP (!) 143/97   Pulse 87   Temp 98 F (36.7 C) (Temporal)   Ht 6\' 2"  (1.88 m)   Wt 196 lb 12.8 oz (89.3 kg)   SpO2 98%   BMI 25.27 kg/m   Gen: No acute distress, resting comfortably MSK: - Right arm: Neurovascular intact distally.  Strength out of 5 throughout.  Tinel's sign negative at wrist and medial epicondyle.  Normal supraspinatus testing.  Mild pain with external rotation.  Normal internal rotation.  Spurling test positive. Neuro: Grossly normal, moves all extremities Psych: Normal affect and thought content      Santa Abdelrahman M. , MD 07/25/2022 9:38 AM

## 2022-07-27 NOTE — Therapy (Deleted)
OUTPATIENT PHYSICAL THERAPY CERVICAL EVALUATION   Patient Name: Todd Rogers MRN: [id number] DOB:1961-03-03, 61 y.o., male Today's Date: 07/27/2022    Past Medical History:  Diagnosis Date   Allergy    Past Surgical History:  Procedure Laterality Date   HAND SURGERY  2009   patient stated he cut his hand, and part of a bone had to be removed.   SPERMATOCELECTOMY Bilateral 09/24/2018   Procedure: SPERMATOCELECTOMY;  Surgeon: Bjorn Pippin, MD;  Location: [facility];  Service: Urology;  Laterality: Bilateral;   WISDOM TOOTH EXTRACTION     Patient Active Problem List   Diagnosis Date Noted   Cervical radiculopathy 07/25/2022   Pulmonary nodule 03/16/2022   Coronary artery calcification 03/16/2022   Seasonal allergies 01/20/2022   Low vitamin D level 02/26/2019   Elevated blood pressure reading 02/26/2019   Elevated PSA 07/23/2018   Spermatocele of epididymis, multiple 07/23/2018    PCP: ***  REFERRING PROVIDER: ***  REFERRING DIAG: ***  THERAPY DIAG:  No diagnosis found.  Rationale for Evaluation and Treatment {HABREHAB:27488}  ONSET DATE: ***  SUBJECTIVE:                                                                                                                                                                                                         SUBJECTIVE STATEMENT: Started about a month ago, reports good ROM, but after resting a while it starts throbbing, also reports numbness and tingling and trouble sleeping at night as he sleeps on that side usually. 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  PERTINENT HISTORY:  None  PAIN:  Are you having pain? Yes: NPRS scale: ***/10 Pain location: *** Pain description: *** Aggravating factors: *** Relieving factors: ***  PRECAUTIONS: {Therapy precautions:24002}  WEIGHT BEARING RESTRICTIONS No  FALLS:  Has patient fallen in last 6 months?  {fallsyesno:27318}  LIVING ENVIRONMENT: Lives with: {OPRC lives with:25569::"lives with their family"} Lives in: {Lives in:25570} Stairs: {opstairs:27293} Has following equipment at home: {Assistive devices:23999}  OCCUPATION: Theatre stage manager  PLOF: {PLOF:24004}  PATIENT GOALS ***  OBJECTIVE:   DIAGNOSTIC FINDINGS:  None  PATIENT SURVEYS:  FOTO ***   COGNITION: Overall cognitive status: Within functional limits for tasks assessed   SENSATION: WFL  POSTURE: {posture:25561}  PALPATION: ***   CERVICAL ROM:   {AROM/PROM:27142} ROM A/PROM (deg) eval  Flexion   Extension   Right lateral flexion   Left lateral flexion   Right rotation   Left rotation    (Blank rows = not tested)  UPPER EXTREMITY ROM:  {AROM/PROM:27142} ROM Right eval Left eval  Shoulder flexion    Shoulder extension    Shoulder abduction    Shoulder adduction    Shoulder extension    Shoulder internal rotation    Shoulder external rotation     (Blank rows = not tested)  UPPER EXTREMITY MMT:  MMT Right eval Left eval  Shoulder flexion    Shoulder extension    Shoulder abduction    Shoulder adduction    Shoulder extension    Shoulder internal rotation    Shoulder external rotation     (Blank rows = not tested)  CERVICAL SPECIAL TESTS:  {Cervical special tests:25246}   FUNCTIONAL TESTS:  {Functional tests:24029}   TODAY'S TREATMENT:  Creating, reviewing, and completing below HEP   PATIENT EDUCATION:  Education details: Educated pt on anatomy and physiology of current symptoms, FOTO, diagnosis, prognosis, HEP,  and POC. Person educated: Patient Education method: Medical illustrator Education comprehension: verbalized understanding and returned demonstration   HOME EXERCISE PROGRAM: ***  ASSESSMENT:  CLINICAL IMPRESSION: Patient referred to PT for  .Patient will benefit from skilled PT to address below impairments, limitations and improve overall  function.  OBJECTIVE IMPAIRMENTS: decreased activity tolerance, decreased shoulder mobility, decreased ROM, decreased strength, impaired flexibility, impaired UE use, postural dysfunction, and pain.  ACTIVITY LIMITATIONS: reaching, lifting, carry,  cleaning, driving, and or occupation  PERSONAL FACTORS:  also affecting patient's functional outcome.  REHAB POTENTIAL: Good  CLINICAL DECISION MAKING: Stable/uncomplicated  EVALUATION COMPLEXITY: Low    GOALS: Short term PT Goals Target date: {follow up:25551} Pt will be I and compliant with HEP. Baseline:  Goal status: New Pt will decrease pain by 25% overall Baseline: Goal status: New  Long term PT goals Target date: {follow up:25551} Pt will improve Rt shoulder AROM to [facility] to improve functional reaching Baseline: Goal status: New Pt will improve  Rt shoulder strength to at least 4+/5 MMT to improve functional strength Baseline: Goal status: New Pt will improve FOTO to at least % functional to show improved function Baseline: Goal status: New Pt will reduce pain to overall less than 3/10 with usual activity and work activity. Baseline: Goal status: New  PLAN: PT FREQUENCY: 1***  PT DURATION: ***  PLANNED INTERVENTIONS (unless contraindicated): aquatic PT, Canalith repositioning, cryotherapy, Electrical stimulation, Iontophoresis with 4 mg/ml dexamethasome, Moist heat, traction, Ultrasound, gait training, Therapeutic exercise, balance training, neuromuscular re-education, patient/family education, prosthetic training, manual techniques, passive ROM, dry needling, taping, vasopnuematic device, vestibular, spinal manipulations, joint manipulations   PLAN FOR NEXT SESSION: Assess HEP/update PRN, continue to progress **** mobility, strengthen **** muscles. Decrease patients pain and help minimize ****    Champ Mungo, PT 07/27/2022, 10:36 AM

## 2022-07-28 ENCOUNTER — Ambulatory Visit: Payer: 59 | Admitting: Physical Therapy

## 2022-12-29 ENCOUNTER — Ambulatory Visit: Payer: 59 | Admitting: Family Medicine

## 2022-12-29 ENCOUNTER — Encounter: Payer: Self-pay | Admitting: Family Medicine

## 2022-12-29 VITALS — BP 145/90 | HR 75 | Temp 97.8°F | Ht 74.0 in | Wt 203.0 lb

## 2022-12-29 DIAGNOSIS — I2584 Coronary atherosclerosis due to calcified coronary lesion: Secondary | ICD-10-CM

## 2022-12-29 DIAGNOSIS — R030 Elevated blood-pressure reading, without diagnosis of hypertension: Secondary | ICD-10-CM

## 2022-12-29 DIAGNOSIS — M5412 Radiculopathy, cervical region: Secondary | ICD-10-CM

## 2022-12-29 DIAGNOSIS — R972 Elevated prostate specific antigen [PSA]: Secondary | ICD-10-CM | POA: Diagnosis not present

## 2022-12-29 DIAGNOSIS — I2510 Atherosclerotic heart disease of native coronary artery without angina pectoris: Secondary | ICD-10-CM

## 2022-12-29 NOTE — Assessment & Plan Note (Signed)
He will come back soon for CPE and we can check PSA at that time.

## 2022-12-29 NOTE — Assessment & Plan Note (Signed)
He will come back soon for CPE.  Will check lipids at that time.

## 2022-12-29 NOTE — Patient Instructions (Signed)
It was very nice to see you today!  Your blood pressure looks good today!  We do not need to start any medications.  We will continue to keep an eye on this.  Will see you back soon for your annual physical.  Please come back to see [place] sooner if needed.  Take care, Dr Jerline Pain  PLEASE NOTE:  If you had any lab tests, please let us know if you have not heard back within a few days. You may see your results on mychart before we have a chance to review them but we will give you a call once they are reviewed by [place].   If we ordered any referrals today, please let us know if you have not heard from their office within the next week.   If you had any urgent prescriptions sent in today, please check with the pharmacy within an hour of our visit to make sure the prescription was transmitted appropriately.   Please try these tips to maintain a healthy lifestyle:  Eat at least 3 REAL meals and 1-2 snacks per day.  Aim for no more than 5 hours between eating.  If you eat breakfast, please do so within one hour of getting up.   Each meal should contain half fruits/vegetables, one quarter protein, and one quarter carbs (no bigger than a computer mouse)  Cut down on sweet beverages. This includes juice, soda, and sweet tea.   Drink at least 1 glass of water with each meal and aim for at least 8 glasses per day  Exercise at least 150 minutes every week.

## 2022-12-29 NOTE — Assessment & Plan Note (Signed)
Blood pressure is borderline.  Was 125/84 initially here today.  He is not currently having symptoms.  We did discuss medications however given his low rating here we will defer for now.  He will come back to see me in a few weeks for CPE.  If still elevated would consider low-dose amlodipine 5 mg daily.  He will continue to monitor at home.

## 2022-12-29 NOTE — Assessment & Plan Note (Signed)
Doing well recently.  Has Flexeril and diclofenac to use as needed.  No recent flares.

## 2022-12-29 NOTE — Progress Notes (Signed)
   LELAND RAVER is a 62 y.o. male who presents today for an office visit.  Assessment/Plan:  Chronic Problems Addressed Today: Elevated blood pressure reading Blood pressure is borderline.  Was 125/84 initially here today.  He is not currently having symptoms.  We did discuss medications however given his low rating here we will defer for now.  He will come back to see me in a few weeks for CPE.  If still elevated would consider low-dose amlodipine 5 mg daily.  He will continue to monitor at home.  Elevated PSA He will come back soon for CPE and we can check PSA at that time.  Coronary artery calcification He will come back soon for CPE.  Will check lipids at that time.  Cervical radiculopathy Doing well recently.  Has Flexeril and diclofenac to use as needed.  No recent flares.     Subjective:  HPI:  See A/p for status of chronic conditions.  His main concern today is elevated blood pressure readings.  He is checking his blood pressure at the gym with some readings into the 180s.  Does not check it at work or at home.  Does not have any symptoms with this.       Objective:  Physical Exam: BP (!) 145/90   Pulse 75   Temp 97.8 F (36.6 C) (Temporal)   Ht 6\' 2"  (1.88 m)   Wt 203 lb (92.1 kg)   SpO2 97%   BMI 26.06 kg/m   Gen: No acute distress, resting comfortably CV: Regular rate and rhythm with no murmurs appreciated Pulm: Normal work of breathing, clear to auscultation bilaterally with no crackles, wheezes, or rhonchi Neuro: Grossly normal, moves all extremities Psych: Normal affect and thought content      Kyen Taite M. Jerline Pain, MD 12/29/2022 9:57 AM

## 2023-01-23 ENCOUNTER — Encounter: Payer: Self-pay | Admitting: Family Medicine

## 2023-01-23 ENCOUNTER — Ambulatory Visit (INDEPENDENT_AMBULATORY_CARE_PROVIDER_SITE_OTHER): Payer: 59 | Admitting: Family Medicine

## 2023-01-23 VITALS — BP 154/90 | HR 84 | Temp 97.8°F | Ht 74.0 in | Wt 206.4 lb

## 2023-01-23 DIAGNOSIS — J302 Other seasonal allergic rhinitis: Secondary | ICD-10-CM

## 2023-01-23 DIAGNOSIS — Z77098 Contact with and (suspected) exposure to other hazardous, chiefly nonmedicinal, chemicals: Secondary | ICD-10-CM

## 2023-01-23 DIAGNOSIS — Z0001 Encounter for general adult medical examination with abnormal findings: Secondary | ICD-10-CM | POA: Diagnosis not present

## 2023-01-23 DIAGNOSIS — R030 Elevated blood-pressure reading, without diagnosis of hypertension: Secondary | ICD-10-CM | POA: Diagnosis not present

## 2023-01-23 DIAGNOSIS — R7989 Other specified abnormal findings of blood chemistry: Secondary | ICD-10-CM

## 2023-01-23 DIAGNOSIS — R739 Hyperglycemia, unspecified: Secondary | ICD-10-CM

## 2023-01-23 DIAGNOSIS — R972 Elevated prostate specific antigen [PSA]: Secondary | ICD-10-CM | POA: Diagnosis not present

## 2023-01-23 DIAGNOSIS — I2510 Atherosclerotic heart disease of native coronary artery without angina pectoris: Secondary | ICD-10-CM

## 2023-01-23 DIAGNOSIS — R911 Solitary pulmonary nodule: Secondary | ICD-10-CM

## 2023-01-23 DIAGNOSIS — I2584 Coronary atherosclerosis due to calcified coronary lesion: Secondary | ICD-10-CM

## 2023-01-23 MED ORDER — PATADAY 0.7 % OP SOLN: OPHTHALMIC | 0 refills | Status: AC

## 2023-01-23 MED ORDER — AZELASTINE HCL 0.1 % NA SOLN: 2.0000 | Freq: Two times a day (BID) | NASAL | 12 refills | Status: DC

## 2023-01-23 MED ORDER — FEXOFENADINE HCL 180 MG PO TABS: 180.0000 mg | ORAL_TABLET | Freq: Every day | ORAL | 3 refills | Status: DC

## 2023-01-23 NOTE — Patient Instructions (Addendum)
It was very nice to see you today!  Please come back for your blood work.   Will repeat your CT scan.  Please try the Pataday, Astelin, and Allegra for your allergies.  Please keep an eye on your blood pressure and let us know if it is persistently 140/90 or higher.  We will see you back in year for your next physical.  Come back sooner if needed.   Take care, Dr Jerline Pain  PLEASE NOTE:  If you had any lab tests, please let us know if you have not heard back within a few days. You may see your results on mychart before we have a chance to review them but we will give you a call once they are reviewed by [place].   If we ordered any referrals today, please let us know if you have not heard from their office within the next week.   If you had any urgent prescriptions sent in today, please check with the pharmacy within an hour of our visit to make sure the prescription was transmitted appropriately.   Please try these tips to maintain a healthy lifestyle:  Eat at least 3 REAL meals and 1-2 snacks per day.  Aim for no more than 5 hours between eating.  If you eat breakfast, please do so within one hour of getting up.   Each meal should contain half fruits/vegetables, one quarter protein, and one quarter carbs (no bigger than a computer mouse)  Cut down on sweet beverages. This includes juice, soda, and sweet tea.   Drink at least 1 glass of water with each meal and aim for at least 8 glasses per day  Exercise at least 150 minutes every week.    Preventive Care 30-42 Years Old, Male Preventive care refers to lifestyle choices and visits with your health care provider that can promote health and wellness. Preventive care visits are also called wellness exams. What can I expect for my preventive care visit? Counseling During your preventive care visit, your health care provider may ask about your: Medical history, including: Past medical problems. Family medical history. Current health,  including: Emotional well-being. Home life and relationship well-being. Sexual activity. Lifestyle, including: Alcohol, nicotine or tobacco, and drug use. Access to firearms. Diet, exercise, and sleep habits. Safety issues such as seatbelt and bike helmet use. Sunscreen use. Work and work Statistician. Physical exam Your health care provider will check your: Height and weight. These may be used to calculate your BMI (body mass index). BMI is a measurement that tells if you are at a healthy weight. Waist circumference. This measures the distance around your waistline. This measurement also tells if you are at a healthy weight and may help predict your risk of certain diseases, such as type 2 diabetes and high blood pressure. Heart rate and blood pressure. Body temperature. Skin for abnormal spots. What immunizations do I need?  Vaccines are usually given at various ages, according to a schedule. Your health care provider will recommend vaccines for you based on your age, medical history, and lifestyle or other factors, such as travel or where you work. What tests do I need? Screening Your health care provider may recommend screening tests for certain conditions. This may include: Lipid and cholesterol levels. Diabetes screening. This is done by checking your blood sugar (glucose) after you have not eaten for a while (fasting). Hepatitis B test. Hepatitis C test. HIV (human immunodeficiency virus) test. STI (sexually transmitted infection) testing, if you are at  risk. Lung cancer screening. Prostate cancer screening. Colorectal cancer screening. Talk with your health care provider about your test results, treatment options, and if necessary, the need for more tests. Follow these instructions at home: Eating and drinking  Eat a diet that includes fresh fruits and vegetables, whole grains, lean protein, and low-fat dairy products. Take vitamin and mineral supplements as recommended  by your health care provider. Do not drink alcohol if your health care provider tells you not to drink. If you drink alcohol: Limit how much you have to 0-2 drinks a day. Know how much alcohol is in your drink. In the [place] one drink equals one 12 oz bottle of beer (355 mL), one 5 oz glass of wine (148 mL), or one 1 oz glass of hard liquor (44 mL). Lifestyle Brush your teeth every morning and night with fluoride toothpaste. Floss one time each day. Exercise for at least 30 minutes 5 or more days each week. Do not use any products that contain nicotine or tobacco. These products include cigarettes, chewing tobacco, and vaping devices, such as e-cigarettes. If you need help quitting, ask your health care provider. Do not use drugs. If you are sexually active, practice safe sex. Use a condom or other form of protection to prevent STIs. Take aspirin only as told by your health care provider. Make sure that you understand how much to take and what form to take. Work with your health care provider to find out whether it is safe and beneficial for you to take aspirin daily. Find healthy ways to manage stress, such as: Meditation, yoga, or listening to music. Journaling. Talking to a trusted person. Spending time with friends and family. Minimize exposure to UV radiation to reduce your risk of skin cancer. Safety Always wear your seat belt while driving or riding in a vehicle. Do not drive: If you have been drinking alcohol. Do not ride with someone who has been drinking. When you are tired or distracted. While texting. If you have been using any mind-altering substances or drugs. Wear a helmet and other protective equipment during sports activities. If you have firearms in your house, make sure you follow all gun safety procedures. What's next? Go to your health care provider once a year for an annual wellness visit. Ask your health care provider how often you should have your eyes and teeth  checked. Stay up to date on all vaccines. This information is not intended to replace advice given to you by your health care provider. Make sure you discuss any questions you have with your health care provider. Document Revised: 05/11/2021 Document Reviewed: 05/11/2021 Elsevier Patient Education  [place].

## 2023-01-23 NOTE — Assessment & Plan Note (Signed)
Currently not controlled.  He will start Allegra and Pataday drops.  Also recommended Astelin nasal spray.  Will send prescriptions in today.  He will let me know if not improving.

## 2023-01-23 NOTE — Assessment & Plan Note (Signed)
Check vitamin D. 

## 2023-01-23 NOTE — Assessment & Plan Note (Signed)
Incidentally found last serial cardiac CT scan.  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

## 2023-01-23 NOTE — Progress Notes (Signed)
Chief Complaint:  Todd Rogers is a 62 y.o. male who presents today for his annual comprehensive physical exam.    Assessment/Plan:  Chronic Problems Addressed Today: Elevated PSA Follows with urology.  Recently had PSA with work which was stable.  Low vitamin D level Check vitamin D.  Elevated blood pressure reading Initially 137/90 though a bit higher on recheck.  He is blood pressures at home are typically in the 130s over 80s range.  Not currently having any symptoms.  He will continue to monitor at home.  He will let me know if persistently elevated.  May need to start low-dose amlodipine has persistent elevations.  Seasonal allergies Currently not controlled.  He will start Allegra and Pataday drops.  Also recommended Astelin nasal spray.  Will send prescriptions in today.  He will let me know if not improving.  Pulmonary nodule Incidentally found last serial cardiac CT scan.  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  Coronary artery calcification Check lipids.  Coronary artery calcium score was 9 last year.  May need to start statin depending on results of lipids.    Preventative Healthcare: He will come back to check labs.  Due for colon cancer screening next year.  Gets PSAs checked through work.  Up-to-date on vaccines.  Patient Counseling(The following topics were reviewed and/or handout was given):  -Nutrition: Stressed importance of moderation in sodium/caffeine intake, saturated fat and cholesterol, caloric balance, sufficient intake of fresh fruits, vegetables, and fiber.  -Stressed the importance of regular exercise.   -Substance Abuse: Discussed cessation/primary prevention of tobacco, alcohol, or other drug use; driving or other dangerous activities under the influence; availability of treatment for abuse.   -Injury prevention: Discussed safety belts, safety helmets, smoke detector, smoking near bedding or  upholstery.   -Sexuality: Discussed sexually transmitted diseases, partner selection, use of condoms, avoidance of unintended pregnancy and contraceptive alternatives.   -Dental health: Discussed importance of regular tooth brushing, flossing, and dental visits.  -Health maintenance and immunizations reviewed. Please refer to Health maintenance section.  Return to care in 1 year for next preventative visit.     Subjective:  HPI:  He has no acute complaints today.   Lifestyle Diet: Cutting down on meat. Trying to get plenty of fruits and vegetables.  Exercise: Working on [employer]. Spin classes.      01/23/2023    8:44 AM  Depression screen PHQ 2/9  Decreased Interest 0  Down, Depressed, Hopeless 0  PHQ - 2 Score 0    There are no preventive care reminders to display for this patient.   ROS: Per HPI, otherwise a complete review of systems was negative.   PMH:  The following were reviewed and entered/updated in epic: Past Medical History:  Diagnosis Date   Allergy    Patient Active Problem List   Diagnosis Date Noted   Cervical radiculopathy 07/25/2022   Pulmonary nodule 03/16/2022   Coronary artery calcification 03/16/2022   Seasonal allergies 01/20/2022   Low vitamin D level 02/26/2019   Elevated blood pressure reading 02/26/2019   Elevated PSA 07/23/2018   Spermatocele of epididymis, multiple 07/23/2018   Past Surgical History:  Procedure Laterality Date   HAND SURGERY  2009   patient stated he cut his hand, and part of a bone had to be removed.   SPERMATOCELECTOMY Bilateral 09/24/2018   Procedure: SPERMATOCELECTOMY;  Surgeon: Irine Seal, MD;  Location: [facility];  Service: Urology;  Laterality: Bilateral;   WISDOM TOOTH EXTRACTION      Family History  Problem Relation Age of Onset   Hypertension Mother    Diabetes Father    Cancer Daughter        breast    Medications- reviewed and updated Current Outpatient Medications   Medication Sig Dispense Refill   ACAI BERRY PO Take by mouth.     Ascorbic Acid (VITAMIN C) 100 MG tablet Take 100 mg by mouth daily.     azelastine (ASTELIN) 0.1 % nasal spray Place 2 sprays into both nostrils 2 (two) times daily. 30 mL 12   cholecalciferol (VITAMIN D3) 25 MCG (1000 UT) tablet Take 1,000 Units by mouth daily.     cyclobenzaprine (FLEXERIL) 10 MG tablet Take 1-2 tablets (10-20 mg total) by mouth at bedtime. May cause drowsiness. 30 tablet 0   diclofenac (VOLTAREN) 75 MG EC tablet Take 1 tablet (75 mg total) by mouth 2 (two) times daily. 60 tablet 1   diclofenac Sodium (VOLTAREN) 1 % GEL Apply 2 g topically 4 (four) times daily. 100 g 3   fexofenadine (ALLEGRA ALLERGY) 180 MG tablet Take 1 tablet (180 mg total) by mouth daily. 90 tablet 3   Multiple Vitamin (MULTIVITAMIN) tablet Take 1 tablet by mouth daily.     Olopatadine HCl (PATADAY) 0.7 % SOLN 1 drop to each eye daily 2.5 mL 0   zinc gluconate 50 MG tablet Take 50 mg by mouth daily.     No current facility-administered medications for this visit.    Allergies-reviewed and updated No Known Allergies  Social History   Socioeconomic History   Marital status: Married    Spouse name: Not on file   Number of children: Not on file   Years of education: Not on file   Highest education level: Not on file  Occupational History   Not on file  Tobacco Use   Smoking status: Never   Smokeless tobacco: Never  Vaping Use   Vaping Use: Never used  Substance and Sexual Activity   Alcohol use: No   Drug use: No   Sexual activity: Yes  Other Topics Concern   Not on file  Social History Narrative   Not on file   Social Determinants of Health   Financial Resource Strain: Not on file  Food Insecurity: Not on file  Transportation Needs: Not on file  Physical Activity: Not on file  Stress: Not on file  Social Connections: Not on file        Objective:  Physical Exam: BP (!) 154/90   Pulse 84   Temp 97.8 F  (36.6 C) (Temporal)   Ht '6\' 2"'$  (1.88 m)   Wt 206 lb 6.4 oz (93.6 kg)   SpO2 99%   BMI 26.50 kg/m   Body mass index is 26.5 kg/m. Wt Readings from Last 3 Encounters:  01/23/23 206 lb 6.4 oz (93.6 kg)  12/29/22 203 lb (92.1 kg)  07/25/22 196 lb 12.8 oz (89.3 kg)   Gen: NAD, resting comfortably HEENT: TMs normal bilaterally. OP clear. No thyromegaly noted.  CV: RRR with no murmurs appreciated Pulm: NWOB, CTAB with no crackles, wheezes, or rhonchi GI: Normal bowel sounds present. Soft, Nontender, Nondistended. MSK: no edema, cyanosis, or clubbing noted Skin: warm, dry Neuro: CN2-12 grossly intact. Strength 5/5 in upper and lower extremities. Reflexes symmetric and intact bilaterally.  Psych: Normal affect and thought content     Cassidey Barrales M.  Jerline Pain, MD 01/23/2023 9:24 AM

## 2023-01-23 NOTE — Assessment & Plan Note (Signed)
Follows with urology.  Recently had PSA with work which was stable.

## 2023-01-23 NOTE — Assessment & Plan Note (Signed)
Initially 137/90 though a bit higher on recheck.  He is blood pressures at home are typically in the 130s over 80s range.  Not currently having any symptoms.  He will continue to monitor at home.  He will let me know if persistently elevated.  May need to start low-dose amlodipine has persistent elevations.

## 2023-01-23 NOTE — Assessment & Plan Note (Signed)
Check lipids.  Coronary artery calcium score was 9 last year.  May need to start statin depending on results of lipids.

## 2023-02-01 ENCOUNTER — Other Ambulatory Visit: Payer: Self-pay | Admitting: *Deleted

## 2023-02-01 ENCOUNTER — Other Ambulatory Visit (INDEPENDENT_AMBULATORY_CARE_PROVIDER_SITE_OTHER): Payer: 59

## 2023-02-01 DIAGNOSIS — I2510 Atherosclerotic heart disease of native coronary artery without angina pectoris: Secondary | ICD-10-CM

## 2023-02-01 DIAGNOSIS — R972 Elevated prostate specific antigen [PSA]: Secondary | ICD-10-CM | POA: Diagnosis not present

## 2023-02-01 DIAGNOSIS — R7989 Other specified abnormal findings of blood chemistry: Secondary | ICD-10-CM | POA: Diagnosis not present

## 2023-02-01 DIAGNOSIS — I2584 Coronary atherosclerosis due to calcified coronary lesion: Secondary | ICD-10-CM | POA: Diagnosis not present

## 2023-02-01 DIAGNOSIS — R739 Hyperglycemia, unspecified: Secondary | ICD-10-CM

## 2023-02-01 DIAGNOSIS — Z0001 Encounter for general adult medical examination with abnormal findings: Secondary | ICD-10-CM | POA: Diagnosis not present

## 2023-02-01 DIAGNOSIS — Z77098 Contact with and (suspected) exposure to other hazardous, chiefly nonmedicinal, chemicals: Secondary | ICD-10-CM

## 2023-02-01 LAB — LIPID PANEL
Cholesterol: 199 mg/dL (ref 0–200)
HDL: 70.2 mg/dL (ref >39.00)
LDL Cholesterol: 118 mg/dL — ABNORMAL HIGH (ref 0–99)
NonHDL: 129.01
Total CHOL/HDL Ratio: 3
Triglycerides: 57 mg/dL (ref 0.0–149.0)
VLDL: 11.4 mg/dL (ref 0.0–40.0)

## 2023-02-01 LAB — COMPREHENSIVE METABOLIC PANEL
ALT: 27 U/L (ref 0–53)
AST: 27 U/L (ref 0–37)
Albumin: 4.2 g/dL (ref 3.5–5.2)
Alkaline Phosphatase: 95 U/L (ref 39–117)
BUN: 12 mg/dL (ref 6–23)
CO2: 28 mEq/L (ref 19–32)
Calcium: 9.4 mg/dL (ref 8.4–10.5)
Chloride: 105 mEq/L (ref 96–112)
Creatinine, Ser: 1.07 mg/dL (ref 0.40–1.50)
GFR: 75.66 mL/min (ref >60.00)
Glucose, Bld: 84 mg/dL (ref 70–99)
Potassium: 3.9 mEq/L (ref 3.5–5.1)
Sodium: 142 mEq/L (ref 135–145)
Total Bilirubin: 0.6 mg/dL (ref 0.2–1.2)
Total Protein: 6.8 g/dL (ref 6.0–8.3)

## 2023-02-01 LAB — CBC
HCT: 47.6 % (ref 39.0–52.0)
Hemoglobin: 16 g/dL (ref 13.0–17.0)
MCHC: 33.6 g/dL (ref 30.0–36.0)
MCV: 86.1 fl (ref 78.0–100.0)
Platelets: 209 10*3/uL (ref 150.0–400.0)
RBC: 5.53 Mil/uL (ref 4.22–5.81)
RDW: 14.5 % (ref 11.5–15.5)
WBC: 4.9 10*3/uL (ref 4.0–10.5)

## 2023-02-01 LAB — HEMOGLOBIN A1C: Hgb A1c MFr Bld: 5.4 % (ref 4.6–6.5)

## 2023-02-01 LAB — TSH: TSH: 1.3 u[IU]/mL (ref 0.35–5.50)

## 2023-02-01 LAB — VITAMIN B12: Vitamin B-12: 679 pg/mL (ref 211–911)

## 2023-02-01 LAB — PSA: PSA: 1.83 ng/mL (ref 0.10–4.00)

## 2023-02-01 LAB — VITAMIN D 25 HYDROXY (VIT D DEFICIENCY, FRACTURES): VITD: 32.03 ng/mL (ref 30.00–100.00)

## 2023-02-02 NOTE — Progress Notes (Signed)
Please inform patient of the following:  His "bad" cholesterol is up a bit.  All of his other labs are stable.  Do not need to make any changes to treatment plan.  He should continue to work on diet and exercise and we can recheck in a year.

## 2023-02-05 LAB — LEAD, BLOOD (ADULT >= 16 YRS): Lead: <1 ug/dL (ref <3.5)

## 2023-02-06 NOTE — Progress Notes (Signed)
Please inform patient of the following:  His lead level is NORMAL.  Todd Rogers. Jerline Pain, MD 02/06/2023 7:29 AM

## 2023-08-03 IMAGING — CT CT CARDIAC CORONARY ARTERY CALCIUM SCORE
3 series · 13 of 20 positions shown, 15 images · non-contrast
Comparison: Chest radiograph 09/27/2022

Addendum:
CLINICAL DATA: Cardiovascular Disease Risk stratification

EXAM:
Coronary Calcium Score
TECHNIQUE: A gated, non-contrast computed tomography scan of the heart was
performed using 3mm slice thickness. Axial images were analyzed on a
dedicated workstation. Calcium scoring of the coronary arteries was
performed using the Agatston method.

[Series 2: cascseq 2.0 sa36 (id) (id) · axial · 0.38mm/px · z∈[+1363,+1453]mm · 4 of 76 slices shown]
[im 16/76  vessel]
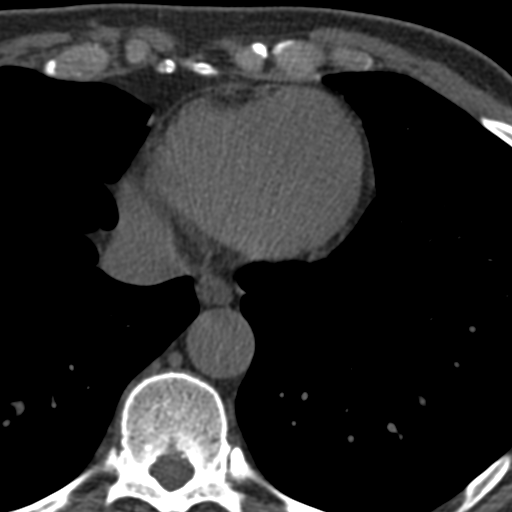
[im 31/76  vessel]
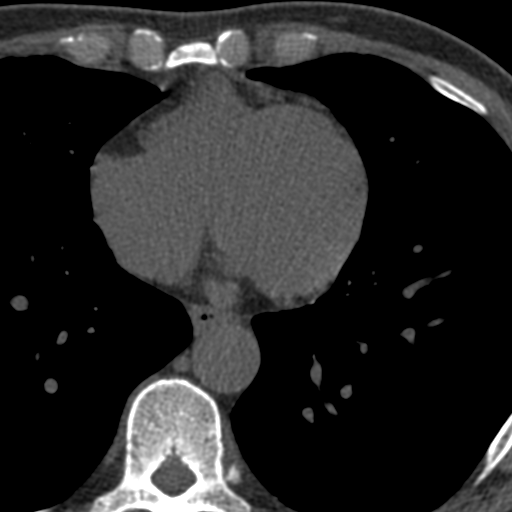
[im 46/76  vessel]
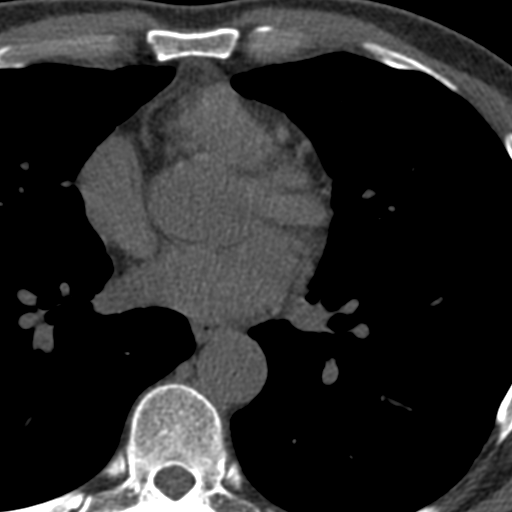
[im 61/76  vessel]
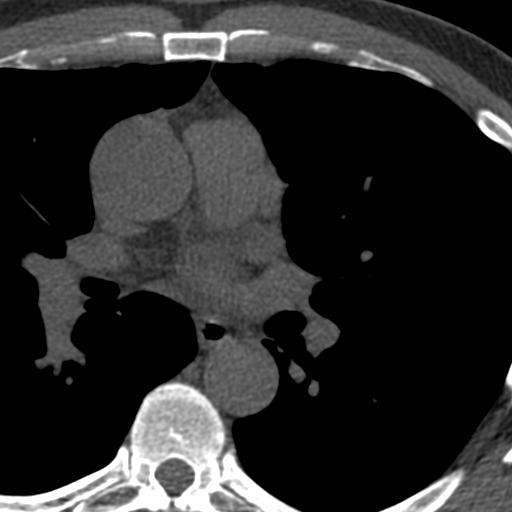

[Series 3: cascseq 2.0 bf37 st · axial · 0.65mm/px · z∈[+1357,+1457]mm · 5 of 76 slices shown, 7 images]
[im 13/76  vessel]
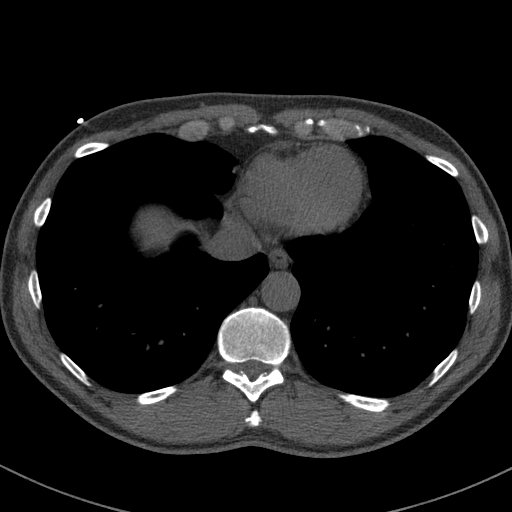
[im 13/76  lung]
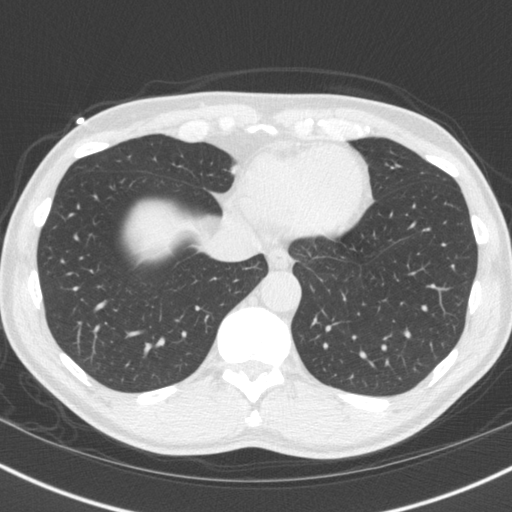
[im 26/76  vessel]
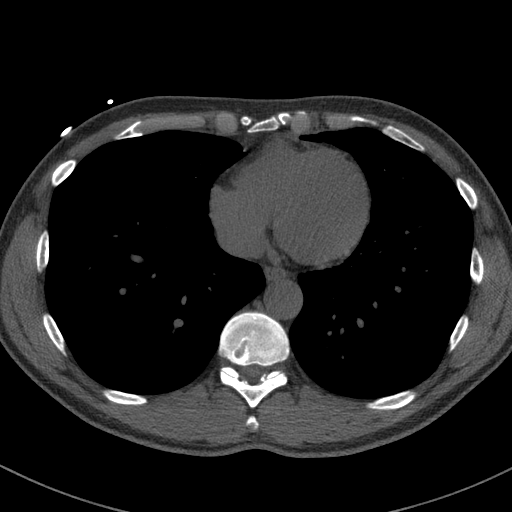
[im 38/76  vessel]
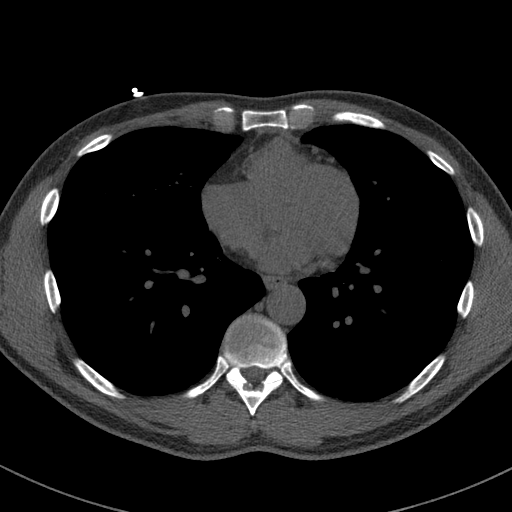
[im 51/76  vessel]
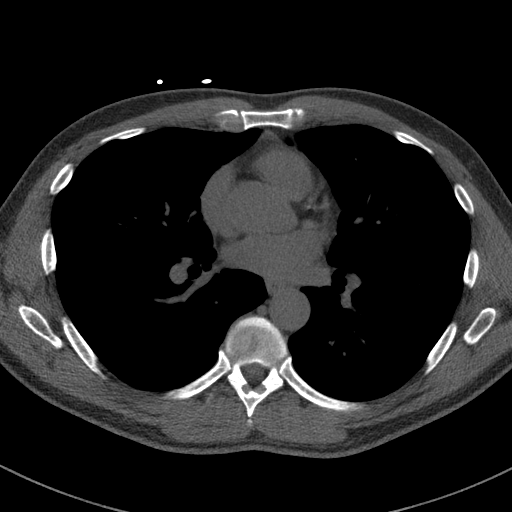
[im 63/76  vessel]
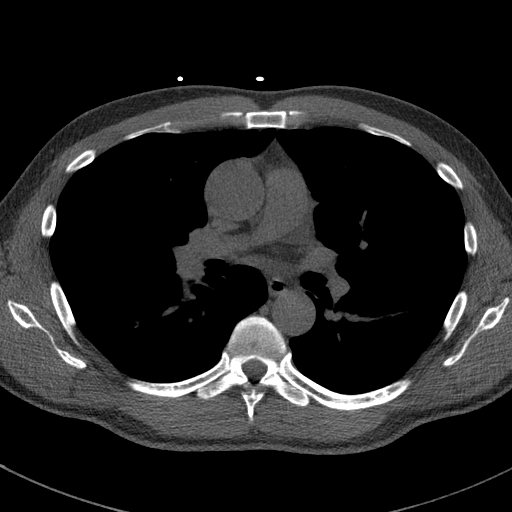
[im 63/76  lung]
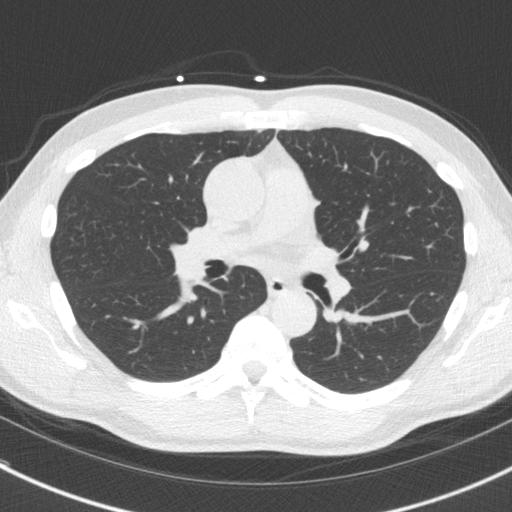

[Series 4: cascseq 2.0 br59 lung · axial · 0.65mm/px · z∈[+1357,+1433]mm · 4 of 76 slices shown]
[im 13/76  lung]
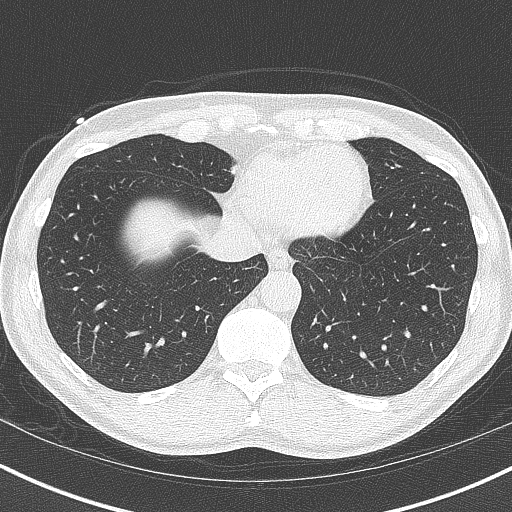
[im 26/76  lung]
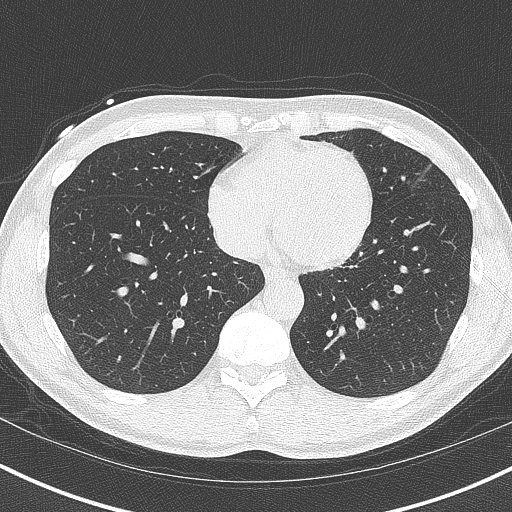
[im 38/76  lung]
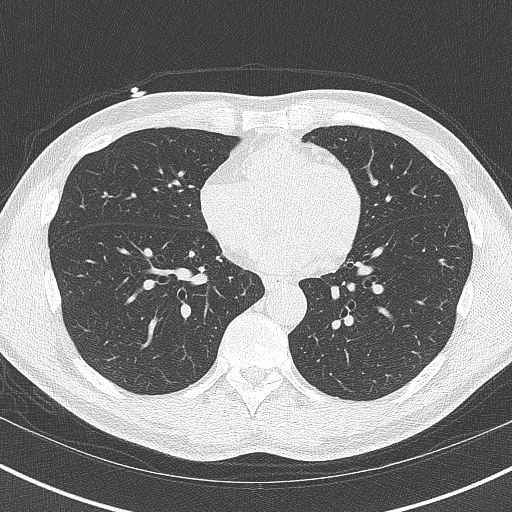
[im 51/76  lung]
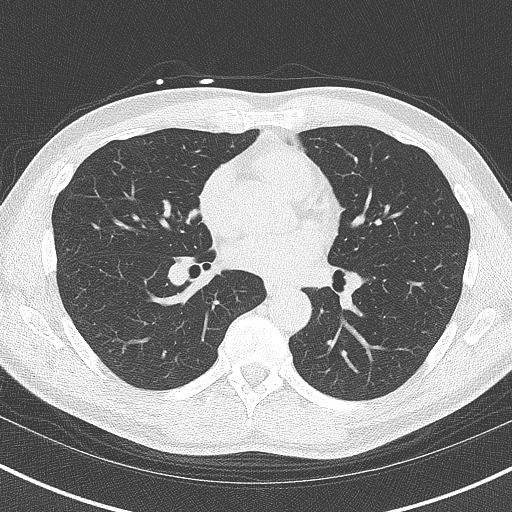

[13 of 20 positions shown; findings below may reference images not displayed]

FINDINGS: Coronary arteries: Normal origins.

Coronary Calcium Score:

Left main: 0

Left anterior descending artery:

Left circumflex artery: 0

Right coronary artery: 0

Total:

Percentile: 64th

Pericardium: Normal.

Ascending Aorta: Mildly dilated measuring 40mm at the bifurcation of
the main pulmonary artery. Consider dedicated Chest CTA or MRI/MRA
to further assess. Scattered calcifications.

Non-cardiac: See separate report from [REDACTED].
IMPRESSION: Coronary calcium score of 9.28. This was 64th percentile for age-,
race-, and sex-matched controls.

Mildly dilated measuring 40mm at the bifurcation of the main
pulmonary artery. Consider dedicated Chest CTA or MRI/MRA to further
assess.



If CAC=0, it is reasonable to withhold statin therapy and reassess
in 5 to 10 years, as long as higher risk conditions are absent
(diabetes mellitus, family history of premature CHD in first degree
relatives (males <55 years; females <65 years), cigarette smoking,
or LDL >=190 mg/dL).

If CAC is 1 to 99, it is reasonable to initiate statin therapy for
patients >=55 years of age.

If CAC is >=100 or >=75th percentile, it is reasonable to initiate
statin therapy at any age.

Cardiology referral should be considered for patients with CAC
scores >=400 or >=75th percentile.

*0869 AHA/ACC/AACVPR/AAPA/ABC/STEPHANE/KAKI/SOM/Don Lolito/KIKU/DONTA/INDER
Guideline on the Management of Blood Cholesterol: A Report of the
American College of Cardiology/American Heart Association Task Force
on Clinical Practice Guidelines. J Am Coll Cardiol.
9907;73(24):3556-3635.

EXAM:
OVER-READ INTERPRETATION  CT CHEST

The following report is an over-read performed by radiologist Dr.
does not include interpretation of cardiac or coronary anatomy or
pathology. The coronary calcium score interpretation by the
cardiologist is attached.
FINDINGS: Visualized mediastinal structures are normal. Images of the upper
abdomen are unremarkable. No airspace disease or consolidation in
the visualized lungs. 4 mm nodule in the lingula on sequence 4,
image 49. No acute bone abnormality.
IMPRESSION: 1. No acute extracardiac findings.
2. 4 mm nodule in the lingula is indeterminate. No follow-up needed
if patient is low-risk. Non-contrast chest CT can be considered in
12 months if patient is high-risk. This recommendation follows the
consensus statement: Guidelines for Management of Incidental
Pulmonary Nodules Detected on CT Images: From the [HOSPITAL]

*** End of Addendum ***
FINDINGS: Coronary arteries: Normal origins.

Coronary Calcium Score:

Left main: 0

Left anterior descending artery:

Left circumflex artery: 0

Right coronary artery: 0

Total:

Percentile: 64th

Pericardium: Normal.

Ascending Aorta: Mildly dilated measuring 40mm at the bifurcation of
the main pulmonary artery. Consider dedicated Chest CTA or MRI/MRA
to further assess. Scattered calcifications.

Non-cardiac: See separate report from [REDACTED].
IMPRESSION: Coronary calcium score of 9.28. This was 64th percentile for age-,
race-, and sex-matched controls.

Mildly dilated measuring 40mm at the bifurcation of the main
pulmonary artery. Consider dedicated Chest CTA or MRI/MRA to further
assess.



If CAC=0, it is reasonable to withhold statin therapy and reassess
in 5 to 10 years, as long as higher risk conditions are absent
(diabetes mellitus, family history of premature CHD in first degree
relatives (males <55 years; females <65 years), cigarette smoking,
or LDL >=190 mg/dL).

If CAC is 1 to 99, it is reasonable to initiate statin therapy for
patients >=55 years of age.

If CAC is >=100 or >=75th percentile, it is reasonable to initiate
statin therapy at any age.

Cardiology referral should be considered for patients with CAC
scores >=400 or >=75th percentile.

*0869 AHA/ACC/AACVPR/AAPA/ABC/STEPHANE/KAKI/SOM/Don Lolito/KIKU/DONTA/INDER
Guideline on the Management of Blood Cholesterol: A Report of the
American College of Cardiology/American Heart Association Task Force
on Clinical Practice Guidelines. J Am Coll Cardiol.
9907;73(24):3556-3635.

## 2023-09-24 ENCOUNTER — Ambulatory Visit: Payer: 59 | Admitting: Family Medicine

## 2023-09-24 ENCOUNTER — Encounter: Payer: Self-pay | Admitting: Family Medicine

## 2023-09-24 VITALS — BP 130/84 | HR 80 | Temp 97.2°F | Ht 74.0 in | Wt 206.0 lb

## 2023-09-24 DIAGNOSIS — R030 Elevated blood-pressure reading, without diagnosis of hypertension: Secondary | ICD-10-CM

## 2023-09-24 MED ORDER — AMLODIPINE BESYLATE 2.5 MG PO TABS: 2.5000 mg | ORAL_TABLET | Freq: Every day | ORAL | 5 refills | Status: DC

## 2023-09-24 NOTE — Progress Notes (Signed)
   Todd Rogers is a 62 y.o. male who presents today for an office visit.  Assessment/Plan:  Chronic Problems Addressed Today: Elevated blood pressure reading Blood pressure is at goal today.  He has had fluctuating blood pressures for the last several months with highest readings in the 160s over 90s.  We did discuss management options.  We discussed lifestyle modifications and discussed the DASH diet.  Handout was given regarding this.    At this point given his higher readings he is agreeable to start low-dose antihypertensive.  Will start amlodipine 2.5 mg daily.  We discussed potential side effects.  He will follow-up with [place] in a week or 2 and we can adjust as needed.     Subjective:  HPI:  See Assessment / plan for status of chronic conditions.  Patient is here today with high blood pressure readings.  He has been monitoring his blood pressure at home and has noticed highest readings into the 160/90's. Normally closer to 140/80.  Denies any chest pain or shortness of breath though occasionally can feel his heartbeat in his chest.       Objective:  Physical Exam: BP 130/84   Pulse 80   Temp (!) 97.2 F (36.2 C) (Temporal)   Ht 6\' 2"  (1.88 m)   Wt 206 lb (93.4 kg)   SpO2 99%   BMI 26.45 kg/m   Gen: No acute distress, resting comfortably CV: Regular rate and rhythm with no murmurs appreciated Pulm: Normal work of breathing, clear to auscultation bilaterally with no crackles, wheezes, or rhonchi Neuro: Grossly normal, moves all extremities Psych: Normal affect and thought content      Todd Rogers M. [name], MD 09/24/2023 10:02 AM

## 2023-09-24 NOTE — Assessment & Plan Note (Signed)
Blood pressure is at goal today.  He has had fluctuating blood pressures for the last several months with highest readings in the 160s over 90s.  We did discuss management options.  We discussed lifestyle modifications and discussed the DASH diet.  Handout was given regarding this.    At this point given his higher readings he is agreeable to start low-dose antihypertensive.  Will start amlodipine 2.5 mg daily.  We discussed potential side effects.  He will follow-up with [place] in a week or 2 and we can adjust as needed.

## 2023-09-24 NOTE — Patient Instructions (Signed)
It was very nice to see you today!  Please start the load with amlodipine.  Follow-up with [place] in a couple of weeks to let us know how you are doing.  Take care, Dr [name]  PLEASE NOTE:  If you had any lab tests, please let us know if you have not heard back within a few days. You may see your results on mychart before we have a chance to review them but we will give you a call once they are reviewed by [place].   If we ordered any referrals today, please let us know if you have not heard from their office within the next week.   If you had any urgent prescriptions sent in today, please check with the pharmacy within an hour of our visit to make sure the prescription was transmitted appropriately.   Please try these tips to maintain a healthy lifestyle:  Eat at least 3 REAL meals and 1-2 snacks per day.  Aim for no more than 5 hours between eating.  If you eat breakfast, please do so within one hour of getting up.   Each meal should contain half fruits/vegetables, one quarter protein, and one quarter carbs (no bigger than a computer mouse)  Cut down on sweet beverages. This includes juice, soda, and sweet tea.   Drink at least 1 glass of water with each meal and aim for at least 8 glasses per day  Exercise at least 150 minutes every week.    DASH Eating Plan DASH stands for Dietary Approaches to Stop Hypertension. The DASH eating plan is a healthy eating plan that has been shown to: Lower high blood pressure (hypertension). Reduce your risk for type 2 diabetes, heart disease, and stroke. Help with weight loss. What are tips for following this plan? Reading food labels Check food labels for the amount of salt (sodium) per serving. Choose foods with less than 5 percent of the Daily Value (DV) of sodium. In general, foods with less than 300 milligrams (mg) of sodium per serving fit into this eating plan. To find whole grains, look for the word "whole" as the first word in the ingredient  list. Shopping Buy products labeled as "low-sodium" or "no salt added." Buy fresh foods. Avoid canned foods and pre-made or frozen meals. Cooking Try not to add salt when you cook. Use salt-free seasonings or herbs instead of table salt or sea salt. Check with your health care provider or pharmacist before using salt substitutes. Do not fry foods. Cook foods in healthy ways, such as baking, boiling, grilling, roasting, or broiling. Cook using oils that are good for your heart. These include olive, canola, avocado, soybean, and sunflower oil. Meal planning  Eat a balanced diet. This should include: 4 or more servings of fruits and 4 or more servings of vegetables each day. Try to fill half of your plate with fruits and vegetables. 6-8 servings of whole grains each day. 6 or less servings of lean meat, poultry, or fish each day. 1 oz is 1 serving. A 3 oz (85 g) serving of meat is about the same size as the palm of your hand. One egg is 1 oz (28 g). 2-3 servings of low-fat dairy each day. One serving is 1 cup (237 mL). 1 serving of nuts, seeds, or beans 5 times each week. 2-3 servings of heart-healthy fats. Healthy fats called omega-3 fatty acids are found in foods such as walnuts, flaxseeds, fortified milks, and eggs. These fats are also found  in cold-water fish, such as sardines, salmon, and mackerel. Limit how much you eat of: Canned or prepackaged foods. Food that is high in trans fat, such as fried foods. Food that is high in saturated fat, such as fatty meat. Desserts and other sweets, sugary drinks, and other foods with added sugar. Full-fat dairy products. Do not salt foods before eating. Do not eat more than 4 egg yolks a week. Try to eat at least 2 vegetarian meals a week. Eat more home-cooked food and less restaurant, buffet, and fast food. Lifestyle When eating at a restaurant, ask if your food can be made with less salt or no salt. If you drink alcohol: Limit how much you have  to: 0-1 drink a day if you are male. 0-2 drinks a day if you are male. Know how much alcohol is in your drink. In the [place] one drink is one 12 oz bottle of beer (355 mL), one 5 oz glass of wine (148 mL), or one 1 oz glass of hard liquor (44 mL). General information Avoid eating more than 2,300 mg of salt a day. If you have hypertension, you may need to reduce your sodium intake to 1,500 mg a day. Work with your provider to stay at a healthy body weight or lose weight. Ask what the best weight range is for you. On most days of the week, get at least 30 minutes of exercise that causes your heart to beat faster. This may include walking, swimming, or biking. Work with your provider or dietitian to adjust your eating plan to meet your specific calorie needs. What foods should I eat? Fruits All fresh, dried, or frozen fruit. Canned fruits that are in their natural juice and do not have sugar added to them. Vegetables Fresh or frozen vegetables that are raw, steamed, roasted, or grilled. Low-sodium or reduced-sodium tomato and vegetable juice. Low-sodium or reduced-sodium tomato sauce and tomato paste. Low-sodium or reduced-sodium canned vegetables. Grains Whole-grain or whole-wheat bread. Whole-grain or whole-wheat pasta. Brown rice. Orpah Cobb. Bulgur. Whole-grain and low-sodium cereals. Pita bread. Low-fat, low-sodium crackers. Whole-wheat flour tortillas. Meats and other proteins Skinless chicken or [place]. Ground chicken or [place]. Pork with fat trimmed off. Fish and seafood. Egg whites. Dried beans, peas, or lentils. Unsalted nuts, nut butters, and seeds. Unsalted canned beans. Lean cuts of beef with fat trimmed off. Low-sodium, lean precooked or cured meat, such as sausages or meat loaves. Dairy Low-fat (1%) or fat-free (skim) milk. Reduced-fat, low-fat, or fat-free cheeses. Nonfat, low-sodium ricotta or cottage cheese. Low-fat or nonfat yogurt. Low-fat, low-sodium cheese. Fats and  oils Soft margarine without trans fats. Vegetable oil. Reduced-fat, low-fat, or light mayonnaise and salad dressings (reduced-sodium). Canola, safflower, olive, avocado, soybean, and sunflower oils. Avocado. Seasonings and condiments Herbs. Spices. Seasoning mixes without salt. Other foods Unsalted popcorn and pretzels. Fat-free sweets. The items listed above may not be all the foods and drinks you can have. Talk to a dietitian to learn more. What foods should I avoid? Fruits Canned fruit in a light or heavy syrup. Fried fruit. Fruit in cream or butter sauce. Vegetables Creamed or fried vegetables. Vegetables in a cheese sauce. Regular canned vegetables that are not marked as low-sodium or reduced-sodium. Regular canned tomato sauce and paste that are not marked as low-sodium or reduced-sodium. Regular tomato and vegetable juices that are not marked as low-sodium or reduced-sodium. Rosita Fire. Olives. Grains Baked goods made with fat, such as croissants, muffins, or some breads. Dry pasta or rice meal  packs. Meats and other proteins Fatty cuts of meat. Ribs. Fried meat. Tomasa Blase. Bologna, salami, and other precooked or cured meats, such as sausages or meat loaves, that are not lean and low in sodium. Fat from the back of a pig (fatback). Bratwurst. Salted nuts and seeds. Canned beans with added salt. Canned or smoked fish. Whole eggs or egg yolks. Chicken or [place] with skin. Dairy Whole or 2% milk, cream, and half-and-half. Whole or full-fat cream cheese. Whole-fat or sweetened yogurt. Full-fat cheese. Nondairy creamers. Whipped toppings. Processed cheese and cheese spreads. Fats and oils Butter. Stick margarine. Lard. Shortening. Ghee. Bacon fat. Tropical oils, such as coconut, palm kernel, or palm oil. Seasonings and condiments Onion salt, garlic salt, seasoned salt, table salt, and sea salt. Worcestershire sauce. Tartar sauce. Barbecue sauce. Teriyaki sauce. Soy sauce, including reduced-sodium soy  sauce. Steak sauce. Canned and packaged gravies. Fish sauce. Oyster sauce. Cocktail sauce. Store-bought horseradish. Ketchup. Mustard. Meat flavorings and tenderizers. Bouillon cubes. Hot sauces. Pre-made or packaged marinades. Pre-made or packaged taco seasonings. Relishes. Regular salad dressings. Other foods Salted popcorn and pretzels. The items listed above may not be all the foods and drinks you should avoid. Talk to a dietitian to learn more. Where to find more information National Heart, Lung, and Blood Institute (NHLBI): BuffaloDryCleaner.gl American Heart Association (AHA): heart.org Academy of Nutrition and Dietetics: eatright.org National Kidney Foundation (NKF): kidney.org This information is not intended to replace advice given to you by your health care provider. Make sure you discuss any questions you have with your health care provider. Document Revised: 11/30/2022 Document Reviewed: 11/30/2022 Elsevier Patient Education  2024 Elsevier Inc.   Amlodipine Tablets What is this medication? AMLODIPINE (am LOE di peen) treats high blood pressure and prevents chest pain (angina). It works by relaxing the blood vessels, which helps decrease the amount of work your heart has to do. It belongs to a group of medications called calcium channel blockers. This medicine may be used for other purposes; ask your health care provider or pharmacist if you have questions. COMMON BRAND NAME(S): Norvasc What should I tell my care team before I take this medication? They need to know if you have any of these conditions: Heart disease Liver disease An unusual or allergic reaction to amlodipine, other medications, foods, dyes, or preservatives Pregnant or trying to get pregnant Breastfeeding How should I use this medication? Take this medication by mouth. Take it as directed on the prescription label at the same time every day. You can take it with or without food. If it upsets your stomach, take it  with food. Keep taking it unless your care team tells you to stop. Talk to your care team about the use of this medication in children. While it may be prescribed for children as young as 6 for selected conditions, precautions do apply. Overdosage: If you think you have taken too much of this medicine contact a poison control center or emergency room at once. NOTE: This medicine is only for you. Do not share this medicine with others. What if I miss a dose? If you miss a dose, take it as soon as you can. If it is almost time for your next dose, take only that dose. Do not take double or extra doses. What may interact with this medication? Clarithromycin Cyclosporine Diltiazem Itraconazole Simvastatin Tacrolimus This list may not describe all possible interactions. Give your health care provider a list of all the medicines, herbs, non-prescription drugs, or dietary supplements you use.  Also tell them if you smoke, drink alcohol, or use illegal drugs. Some items may interact with your medicine. What should I watch for while using this medication? Visit your care team for regular checks on your progress. Check your blood pressure as directed. Know what your blood pressure should be and when to contact your care team. Do not treat yourself for coughs, colds, or pain while you are using this medication without asking your care team for advice. Some medications may increase your blood pressure. This medication may affect your coordination, reaction time, or judgment. Do not drive or operate machinery until you know how this medication affects you. Sit up or stand slowly to reduce the risk of dizzy or fainting spells. Drinking alcohol with this medication can increase the risk of these side effects. What side effects may I notice from receiving this medication? Side effects that you should report to your care team as soon as possible: Allergic reactions--skin rash, itching, hives, swelling of the face,  lips, tongue, or throat Heart attack--pain or tightness in the chest, shoulders, arms, or jaw, nausea, shortness of breath, cold or clammy skin, feeling faint or lightheaded Low blood pressure--dizziness, feeling faint or lightheaded, blurry vision Worsening chest pain (angina)--pain, pressure, or tightness in the chest, neck, back, or arms Side effects that usually do not require medical attention (report these to your care team if they continue or are bothersome): Facial flushing, redness Heart palpitations--rapid, pounding, or irregular heartbeat Nausea Stomach pain Swelling of the ankles, hands, or feet This list may not describe all possible side effects. Call your doctor for medical advice about side effects. You may report side effects to FDA at 1-800-FDA-1088. Where should I keep my medication? Keep out of the reach of children and pets. Store at room temperature between 20 and 25 degrees C (68 and 77 degrees F). Protect from light and moisture. Keep the container tightly closed. Get rid of any unused medication after the expiration date. To get rid of medications that are no longer needed or have expired: Take the medication to a medication take-back program. Check with your pharmacy or law enforcement to find a location. If you cannot return the medication, check the label or package insert to see if the medication should be thrown out in the garbage or flushed down the toilet. If you are not sure, ask your care team. If it is safe to put in the trash, empty the medication out of the container. Mix the medication with cat litter, dirt, coffee grounds, or other unwanted substance. Seal the mixture in a bag or container. Put it in the trash. NOTE: This sheet is a summary. It may not cover all possible information. If you have questions about this medicine, talk to your doctor, pharmacist, or health care provider.  2024 Elsevier/Gold Standard (2022-06-05 00:00:00)

## 2023-10-29 ENCOUNTER — Encounter: Payer: Self-pay | Admitting: Family Medicine

## 2023-10-29 ENCOUNTER — Ambulatory Visit: Payer: 59 | Admitting: Family Medicine

## 2023-10-29 VITALS — BP 143/89 | HR 83 | Temp 98.2°F | Ht 74.0 in | Wt 208.4 lb

## 2023-10-29 DIAGNOSIS — R002 Palpitations: Secondary | ICD-10-CM

## 2023-10-29 DIAGNOSIS — I10 Essential (primary) hypertension: Secondary | ICD-10-CM | POA: Diagnosis not present

## 2023-10-29 LAB — COMPREHENSIVE METABOLIC PANEL
ALT: 23 U/L (ref 0–53)
AST: 21 U/L (ref 0–37)
Albumin: 4.5 g/dL (ref 3.5–5.2)
Alkaline Phosphatase: 90 U/L (ref 39–117)
BUN: 14 mg/dL (ref 6–23)
CO2: 28 meq/L (ref 19–32)
Calcium: 9.5 mg/dL (ref 8.4–10.5)
Chloride: 105 meq/L (ref 96–112)
Creatinine, Ser: 1.1 mg/dL (ref 0.40–1.50)
GFR: 72.81 mL/min (ref >60.00)
Glucose, Bld: 97 mg/dL (ref 70–99)
Potassium: 4.2 meq/L (ref 3.5–5.1)
Sodium: 140 meq/L (ref 135–145)
Total Bilirubin: 0.5 mg/dL (ref 0.2–1.2)
Total Protein: 7.1 g/dL (ref 6.0–8.3)

## 2023-10-29 LAB — CBC
HCT: 48.4 % (ref 39.0–52.0)
Hemoglobin: 16.1 g/dL (ref 13.0–17.0)
MCHC: 33.2 g/dL (ref 30.0–36.0)
MCV: 87.2 fL (ref 78.0–100.0)
Platelets: 195 10*3/uL (ref 150.0–400.0)
RBC: 5.55 Mil/uL (ref 4.22–5.81)
RDW: 14.3 % (ref 11.5–15.5)
WBC: 5.7 10*3/uL (ref 4.0–10.5)

## 2023-10-29 LAB — TSH: TSH: 1.38 u[IU]/mL (ref 0.35–5.50)

## 2023-10-29 LAB — MAGNESIUM: Magnesium: 2.3 mg/dL (ref 1.5–2.5)

## 2023-10-29 NOTE — Assessment & Plan Note (Signed)
Slightly above goal today though was previously at goal at last office visit.  He is on amlodipine 2.5 mg daily and tolerating well.  He will continue to monitor at home and let us know if persistently elevated.

## 2023-10-29 NOTE — Progress Notes (Signed)
   Todd Rogers is a 62 y.o. male who presents today for an office visit.  Assessment/Plan:  New/Acute Problems: Palpitations Reassuring exam today.  No red flags. EKG today with normal sinus rhythm and no ischemic changes. No ectopic beats.  May be having PVC's.  We did discuss referral to cardiology for further evaluation however he would like to hold off on this for now.  Will check labs including CBC, c-Met, TSH as well as Holter monitor to further evaluate and rule out any arrhythmias.  He will try to continue to work on cutting down caffeine intake.  We did discuss importance of stress management, routine sleep hygiene, alcohol reduction, and avoidance of other possible triggers.  We discussed reasons to return to care and seek emergent care.  Depending on results of above workup may need referral to cardiology.   Chronic Problems Addressed Today: Essential hypertension Slightly above goal today though was previously at goal at last office visit.  He is on amlodipine 2.5 mg daily and tolerating well.  He will continue to monitor at home and let us know if persistently elevated.     Subjective:  HPI:  His main concern today is racing heartbeat. This has been going on for the last 1-2 months. Feels intense heartbeat in his chest and sometimes radiates into his arms.  Symptoms come and go.  Symptoms last for about 30 minutes to an hour and go away. No obvious triggers or precipitating events.  No reported chest pain or shortness of breath.  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  Also has been taking more caffeine than normal.  No reported dizziness or lightheadedness.  No headache.  No vision changes.       Objective:  Physical Exam: BP (!) 143/89   Pulse 83   Temp 98.2 F (36.8 C) (Temporal)   Ht 6\' 2"  (1.88 m)   Wt 208 lb 6.4 oz (94.5 kg)   SpO2 98%   BMI 26.76 kg/m   Gen: No acute distress, resting comfortably CV:  Regular rate and rhythm with no murmurs appreciated Pulm: Normal work of breathing, clear to auscultation bilaterally with no crackles, wheezes, or rhonchi Neuro: Grossly normal, moves all extremities Psych: Normal affect and thought content  EKG: Normal sinus rhythm.  No ischemic changes.      Katina Degree. [name], MD 10/29/2023 11:24 AM

## 2023-10-29 NOTE — Patient Instructions (Signed)
It was very nice to see you today!  I think you may be having premature beats.  This is benign but can be worsened with caffeine and sleep deprivation.  We will check labs and a heart monitor.  Please continue monitor your blood pressure and let us know if it is persistently elevated.  Return if symptoms worsen or fail to improve.   Take care, Dr [name]  PLEASE NOTE:  If you had any lab tests, please let us know if you have not heard back within a few days. You may see your results on mychart before we have a chance to review them but we will give you a call once they are reviewed by [place].   If we ordered any referrals today, please let us know if you have not heard from their office within the next week.   If you had any urgent prescriptions sent in today, please check with the pharmacy within an hour of our visit to make sure the prescription was transmitted appropriately.   Please try these tips to maintain a healthy lifestyle:  Eat at least 3 REAL meals and 1-2 snacks per day.  Aim for no more than 5 hours between eating.  If you eat breakfast, please do so within one hour of getting up.   Each meal should contain half fruits/vegetables, one quarter protein, and one quarter carbs (no bigger than a computer mouse)  Cut down on sweet beverages. This includes juice, soda, and sweet tea.   Drink at least 1 glass of water with each meal and aim for at least 8 glasses per day  Exercise at least 150 minutes every week.

## 2023-10-30 ENCOUNTER — Ambulatory Visit: Payer: 59 | Attending: Family Medicine

## 2023-10-30 DIAGNOSIS — R002 Palpitations: Secondary | ICD-10-CM

## 2023-10-30 NOTE — Progress Notes (Unsigned)
EP to read

## 2023-10-31 NOTE — Progress Notes (Signed)
Great news!  Blood work is all normal.  We will contact him once we get results back on his heart monitor.  Katina Degree. [name], MD 10/31/2023 7:37 AM

## 2023-11-08 DIAGNOSIS — R002 Palpitations: Secondary | ICD-10-CM

## 2023-11-26 ENCOUNTER — Encounter: Payer: Self-pay | Admitting: Family Medicine

## 2023-11-26 DIAGNOSIS — I480 Paroxysmal atrial fibrillation: Secondary | ICD-10-CM | POA: Insufficient documentation

## 2023-11-26 NOTE — Progress Notes (Signed)
His heart monitor showed that he has occasional periods of atrial fibrillation.  This is not an urgent concern however this does need to be investigated further. Recommend referral to cardiology. Please place referral.  He should start taking a daily aspirin as well.

## 2023-11-27 ENCOUNTER — Other Ambulatory Visit: Payer: Self-pay | Admitting: *Deleted

## 2023-11-27 DIAGNOSIS — I4891 Unspecified atrial fibrillation: Secondary | ICD-10-CM

## 2023-11-29 ENCOUNTER — Telehealth: Payer: Self-pay | Admitting: *Deleted

## 2023-11-29 NOTE — Telephone Encounter (Signed)
 Copied from CRM [id number]. Topic: Clinical - Lab/Test Results >> Nov 29, 2023  2:43 PM Chantha C wrote: Reason for CRM: Patient returning Todd Rogers's call on tests results. Please call back at [phone]   See results note

## 2023-12-03 NOTE — Telephone Encounter (Addendum)
 Copied from CRM [id number]. Topic: Clinical - Medical Advice >> Dec 03, 2023  9:57 AM Robinson H wrote: Reason for CRM: Patient wants to speak to provider regarding reason for Cardiology and more on what's going on with his heart, please reach out to patient. Thanks  Time [phone]   Left message to return call to our office at their convenience.  Abrielle Finck,RMA   Spoke with patient, notified PCP send him to Cardiology due to A fib, in the results of his heart monitor show occasional periods of atrial fibrillation.   explain to patient A Fib is a heart condition that causes an irregular and rapid heart beat . Verbalized understanding

## 2023-12-04 DIAGNOSIS — R002 Palpitations: Secondary | ICD-10-CM | POA: Insufficient documentation

## 2023-12-04 NOTE — Progress Notes (Addendum)
 Cardiology Office Note   Date:  12/05/2023   ID:  Todd Rogers, DOB 08/05/1961, MRN [id number]  PCP:  Todd Worth HERO, MD  Cardiologist:   None Referring:  Todd Worth HERO, MD  Chief Complaint  Patient presents with   Palpitations      History of Present Illness: Todd Rogers is a 62 y.o. male who is referred by  Todd Worth HERO, MD for evaluation of palpitations.  He has not had any prior history of early has a history of hypertension recently.  He is not really been taking his medicines for this.  Says his blood pressures are in the 130s maybe at home.  He has been having palpitations probably for about a month or so.  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  He describes his heart racing.  I do see his past cardiac workup to include a minimally elevated coronary calcium  score of 9.28 about 2 years ago.  He wore a monitor recently and he had 9.8% of his beats were supraventricular ectopic beats.  He had some less than 1% PVCs.  He had less than 1% atrial fibrillation.  I did review this monitor for this appointment.  He feels the palpitations randomly and daily.  He is not drinking coffee about 5 weeks ago and went to decaf but still notices them.  He cannot bring them on.  They happen at rest.  They are better when he is rested.  He has chest discomfort at night when he lies down particularly associated with certain foods.  It is really bad after he takes fish oil.  It is a burning discomfort.  He is not describing jaw or arm discomfort.  He is not having any shortness of breath, DOE or orthopnea.  He has not had any presyncope or syncope.  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  He is up-to-date with blood work to include normal TSH and electrolytes.   Past Medical History:  Diagnosis Date   Palpitations     Past Surgical History:  Procedure Laterality Date   HAND SURGERY  2009    patient stated he cut his hand, and part of a bone had to be removed.   SPERMATOCELECTOMY Bilateral 09/24/2018   Procedure: SPERMATOCELECTOMY;  Surgeon: Watt Rush, MD;  Location: [facility];  Service: Urology;  Laterality: Bilateral;   WISDOM TOOTH EXTRACTION       Current Outpatient Medications  Medication Sig Dispense Refill   ACAI BERRY PO Take by mouth.     Ascorbic Acid (VITAMIN C) 100 MG tablet Take 100 mg by mouth daily.     azelastine  (ASTELIN ) 0.1 % nasal spray Place 2 sprays into both nostrils 2 (two) times daily. 30 mL 12   cholecalciferol (VITAMIN D3) 25 MCG (1000 UT) tablet Take 1,000 Units by mouth daily.     cyclobenzaprine  (FLEXERIL ) 10 MG tablet Take 1-2 tablets (10-20 mg total) by mouth at bedtime. May cause drowsiness. 30 tablet 0   diclofenac  (VOLTAREN ) 75 MG EC tablet Take 1 tablet (75 mg total) by mouth 2 (two) times daily. 60 tablet 1   metoprolol  succinate (TOPROL -XL) 50 MG 24 hr tablet Take 1 tablet (50 mg total) by mouth daily. Take with or immediately following a meal. 90 tablet 3   Multiple Vitamin (MULTIVITAMIN) tablet Take 1 tablet by mouth  daily.     Olopatadine  HCl (PATADAY ) 0.7 % SOLN 1 drop to each eye daily 2.5 mL 0   zinc gluconate 50 MG tablet Take 50 mg by mouth daily.     No current facility-administered medications for this visit.    Allergies:   Patient has no known allergies.    Social History:  The patient  reports that he has never smoked. He has never used smokeless tobacco. He reports that he does not drink alcohol and does not use drugs.   Family History:  The patient's family history includes Cancer in his daughter; Diabetes in his father; Hypertension in his mother; Kidney failure in his father.    ROS:  Please see the history of present illness.   Otherwise, review of systems are positive for none.   All other systems are reviewed and negative.    PHYSICAL EXAM: VS:  BP 138/80   Pulse (!) 56   Ht 6' 2 (1.88 m)    Wt 211 lb (95.7 kg)   BMI 27.09 kg/m  , BMI Body mass index is 27.09 kg/m. GENERAL:  Well appearing HEENT:  Pupils equal round and reactive, fundi not visualized, oral mucosa unremarkable NECK:  No jugular venous distention, waveform within normal limits, carotid upstroke brisk and symmetric, no bruits, no thyromegaly LYMPHATICS:  No cervical, inguinal adenopathy LUNGS:  Clear to auscultation bilaterally BACK:  No CVA tenderness CHEST:  Unremarkable HEART:  PMI not displaced or sustained,S1 and S2 within normal limits, no S3, no S4, no clicks, no rubs, no murmurs ABD:  Flat, positive bowel sounds normal in frequency in pitch, no bruits, no rebound, no guarding, no midline pulsatile mass, no hepatomegaly, no splenomegaly EXT:  2 plus pulses throughout, no edema, no cyanosis no clubbing SKIN:  No rashes no nodules NEURO:  Cranial nerves II through XII grossly intact, motor grossly intact throughout PSYCH:  Cognitively intact, oriented to person place and time    EKG: Sinus rhythm, rate 68, axis leftward, RSR prime V1 and V2, no acute ST-T wave changes.  12-24    Recent Labs: 10/29/2023: ALT 23; BUN 14; Creatinine, Ser 1.10; Hemoglobin 16.1; Magnesium 2.3; Platelets 195.0; Potassium 4.2; Sodium 140; TSH 1.38    Lipid Panel    Component Value Date/Time   CHOL 199 02/01/2023 0813   TRIG 57.0 02/01/2023 0813   HDL 70.20 02/01/2023 0813   CHOLHDL 3 02/01/2023 0813   VLDL 11.4 02/01/2023 0813   LDLCALC 118 (H) 02/01/2023 0813      Wt Readings from Last 3 Encounters:  12/05/23 211 lb (95.7 kg)  10/29/23 208 lb 6.4 oz (94.5 kg)  09/24/23 206 lb (93.4 kg)      Other studies Reviewed: Additional studies/ records that were reviewed today include: Review personally of the event monitor and blood work. Review of the above records demonstrates:  Please see elsewhere in the note.     ASSESSMENT AND PLAN:  Palpitations: The patient is having predominantly isolated  supraventricular beats but also some rare atrial fibs.  He is very low thromboembolic risk given the low burden and does not need DOAC.  He probably should get a wearable and we will discuss this.  At this point he needs something for his blood pressure swelling and stop his amlodipine  and start metoprolol  XL 50 mg daily and see if that also helps with palpitations.  I will check an echocardiogram.  HTN: This will be managed as above.  Chest pain: This does  sound like reflux.  However given his coronary calcium  I would like to screen him with a POET (Plain Old Exercise Treadmill)   Dyslipidemia: His LDL is 118 but his HDL is 70.  I would not suggest a statin at this point but we did discuss Mediterranean plant forward diet.   Current medicines are reviewed at length with the patient today.  The patient does not have concerns regarding medicines.  The following changes have been made: As above  Labs/ tests ordered today include:   Orders Placed This Encounter  Procedures   EXERCISE TOLERANCE TEST (ETT)   ECHOCARDIOGRAM COMPLETE     Disposition:   FU with me after the above testing.     Signed, Lynwood Schilling, MD  12/05/2023 10:36 AM    [facility]

## 2023-12-05 ENCOUNTER — Ambulatory Visit: Payer: 59 | Admitting: Cardiology

## 2023-12-05 ENCOUNTER — Encounter: Payer: Self-pay | Admitting: Cardiology

## 2023-12-05 VITALS — BP 138/80 | HR 56 | Ht 74.0 in | Wt 211.0 lb

## 2023-12-05 DIAGNOSIS — R079 Chest pain, unspecified: Secondary | ICD-10-CM | POA: Diagnosis not present

## 2023-12-05 DIAGNOSIS — R002 Palpitations: Secondary | ICD-10-CM

## 2023-12-05 MED ORDER — METOPROLOL SUCCINATE ER 50 MG PO TB24: 50.0000 mg | ORAL_TABLET | Freq: Every day | ORAL | 3 refills | Status: DC

## 2023-12-05 NOTE — Patient Instructions (Signed)
 Medication Instructions:  Please discontinue your Amlodipine  and start Metoprolol  Succinate 50 mg a day. Continue all other medications as listed.  *If you need a refill on your cardiac medications before your next appointment, please call your pharmacy*  Testing/Procedures: Your physician has requested that you have an echocardiogram. Echocardiography is a painless test that uses sound waves to create images of your heart. It provides your doctor with information about the size and shape of your heart and how well your heart's chambers and valves are working. This procedure takes approximately one hour. There are no restrictions for this procedure. Please do NOT wear cologne, perfume, aftershave, or lotions (deodorant is allowed). Please arrive 15 minutes prior to your appointment time.  Please note: We ask at that you not bring children with you during ultrasound (echo/ vascular) testing. Due to room size and safety concerns, children are not allowed in the ultrasound rooms during exams. Our front office staff cannot provide observation of children in our lobby area while testing is being conducted. An adult accompanying a patient to their appointment will only be allowed in the ultrasound room at the discretion of the ultrasound technician under special circumstances. We apologize for any inconvenience.  Your physician has requested that you have an exercise tolerance test. For further information please visit [email or web address] Please also follow instruction sheet, as given.  Both of the above tests will be completed at our [address] location on the 3 rd floor.  You will be contacted to be scheduled.  Follow-Up: At [facility], you and your health needs are our priority.  As part of our continuing mission to provide you with exceptional heart care, we have created designated Provider Care Teams.  These Care Teams include your primary Cardiologist (physician) and Advanced  Practice Providers (APPs -  Physician Assistants and Nurse Practitioners) who all work together to provide you with the care you need, when you need it.  We recommend signing up for the patient portal called MyChart.  Sign up information is provided on this After Visit Summary.  MyChart is used to connect with patients for Virtual Visits (Telemedicine).  Patients are able to view lab/test results, encounter notes, upcoming appointments, etc.  Non-urgent messages can be sent to your provider as well.   To learn more about what you can do with MyChart, go to [email or web address].    Your next appointment:   Follow up with Dr Lavona after the above testing.

## 2023-12-21 ENCOUNTER — Ambulatory Visit
Admission: RE | Admit: 2023-12-21 | Discharge: 2023-12-21 | Disposition: A | Discharge status: Home / Self Care | Payer: 59 | Source: Ambulatory Visit | Attending: Nurse Practitioner | Admitting: Nurse Practitioner

## 2023-12-21 ENCOUNTER — Other Ambulatory Visit: Payer: Self-pay | Admitting: Nurse Practitioner

## 2023-12-21 DIAGNOSIS — Z0000 Encounter for general adult medical examination without abnormal findings: Secondary | ICD-10-CM

## 2023-12-24 ENCOUNTER — Encounter (HOSPITAL_COMMUNITY): Payer: Self-pay

## 2023-12-25 ENCOUNTER — Other Ambulatory Visit (HOSPITAL_COMMUNITY): Payer: 59

## 2024-01-01 ENCOUNTER — Ambulatory Visit (HOSPITAL_BASED_OUTPATIENT_CLINIC_OR_DEPARTMENT_OTHER): Payer: 59

## 2024-01-01 ENCOUNTER — Ambulatory Visit (HOSPITAL_COMMUNITY): Payer: 59 | Attending: Cardiovascular Disease

## 2024-01-01 DIAGNOSIS — R079 Chest pain, unspecified: Secondary | ICD-10-CM

## 2024-01-01 DIAGNOSIS — R002 Palpitations: Secondary | ICD-10-CM

## 2024-01-01 LAB — EXERCISE TOLERANCE TEST
Angina Index: 0
Duke Treadmill Score: 8
Estimated workload: 9.6
Exercise duration (min): 7 min
Exercise duration (sec): 44 s
MPHR: 160 {beats}/min
Peak HR: 137 {beats}/min
Percent HR: 85 %
Rest HR: 82 {beats}/min
ST Depression (mm): 0 mm

## 2024-01-01 LAB — ECHOCARDIOGRAM COMPLETE
Area-P 1/2: 2.42 cm2
S' Lateral: 2.85 cm

## 2024-01-04 ENCOUNTER — Telehealth: Payer: Self-pay

## 2024-01-04 NOTE — Telephone Encounter (Signed)
-----   Message from Rollene Rotunda sent at 01/03/2024  3:36 PM EST ----- He had PVCs with exercise but no other findings.  Please schedule him follow up likely in April. ----- Message ----- From: Parke Poisson, MD Sent: 01/01/2024  11:54 AM EST To: Rollene Rotunda, MD

## 2024-01-07 ENCOUNTER — Telehealth: Payer: Self-pay | Admitting: Cardiology

## 2024-01-07 NOTE — Telephone Encounter (Signed)
 Eilleen Grates, MD  You9 minutes ago (4:50 PM)    If his HR is above 60s average (given the fact that his BP will tolerate) we could increase the Toprol  XL to 100 mg.  Looks like he is currently on 50 mg.  Thanks.

## 2024-01-07 NOTE — Telephone Encounter (Signed)
 Spoke with patient. He does not have a way to check his pulse but will check manually. Advised to check periodically over the next few days and reports back via MyChart or call with those readings so we can determine if he can safely increase beta-blocker. He agreed w/plan.

## 2024-01-07 NOTE — Telephone Encounter (Signed)
 Spoke with patient of Dr. Lavonne Prairie. He reports fluttering x3 months. He reports despite starting metoprolol  succinate, he is still having symptoms. He reports he cannot get comfortable, impacts sleep. Occurs intermittently  throughout the day. He has no identifiable triggers. Drinks de-caf coffee now. Could be under stress but less since last appointment -- has retired.  From last note: Palpitations: The patient is having predominantly isolated supraventricular beats but also some rare atrial fibs.  He is very low thromboembolic risk given the low burden and does not need DOAC.  He probably should get a wearable and we will discuss this.  At this point he needs something for his blood pressure swelling and stop his amlodipine  and start metoprolol  XL 50 mg daily and see if that also helps with palpitations.  I will check an echocardiogram.   He does not have home wearable monitor or apple watch. He wore an outpatient monitor by PCP previously.   He reports a BP of 179/90 -- this was done at a retirement physical. He reports BP is higher at medical appointments. He does not have a way to check his home BPs or heart rate.   Encouraged [employer] as he has an android. Sent MyChart message w/this info.   Will route to MD for review

## 2024-01-07 NOTE — Telephone Encounter (Signed)
 Patient c/o Palpitations:  High priority if patient c/o lightheadedness, shortness of breath, or chest pain  How long have you had palpitations/irregular HR/ Afib? Are you having the symptoms now? Having flutters   Are you currently experiencing lightheadedness, SOB or CP?  no  Do you have a history of afib (atrial fibrillation) or irregular heart rhythm? Been having them for last 3 months   Have you checked your BP or HR? (document readings if available): 179/90  Are you experiencing any other symptoms? no

## 2024-01-08 ENCOUNTER — Encounter: Payer: Self-pay | Admitting: *Deleted

## 2024-01-10 ENCOUNTER — Telehealth: Payer: Self-pay

## 2024-01-10 NOTE — Telephone Encounter (Signed)
Have left vmm for patient to please call back and schedule an appointment for April of 2025.

## 2024-01-14 ENCOUNTER — Telehealth: Payer: Self-pay

## 2024-01-14 NOTE — Telephone Encounter (Signed)
-----   Message from Rollene Rotunda sent at 01/03/2024  3:36 PM EST ----- He had PVCs with exercise but no other findings.  Please schedule him follow up likely in April. ----- Message ----- From: Parke Poisson, MD Sent: 01/01/2024  11:54 AM EST To: Rollene Rotunda, MD

## 2024-01-14 NOTE — Telephone Encounter (Signed)
 Called patient 2nd attempt to schedule a follow up office visit to discuss recent test results per request of Dr Antoine Poche. Left message for call back to [phone].

## 2024-01-31 ENCOUNTER — Other Ambulatory Visit: Payer: Self-pay | Admitting: Family Medicine

## 2024-02-11 ENCOUNTER — Telehealth: Payer: Self-pay | Admitting: Cardiology

## 2024-02-11 NOTE — Telephone Encounter (Signed)
 New Message      This patient would like to switch from Dr Antoine Poche to Dr Anne Fu at the [facility]. Is this alright with you?:

## 2024-02-14 ENCOUNTER — Ambulatory Visit: Attending: Nurse Practitioner | Admitting: Nurse Practitioner

## 2024-02-14 ENCOUNTER — Encounter: Payer: Self-pay | Admitting: Nurse Practitioner

## 2024-02-14 VITALS — BP 128/90 | Ht 74.0 in | Wt 209.0 lb

## 2024-02-14 DIAGNOSIS — I491 Atrial premature depolarization: Secondary | ICD-10-CM | POA: Diagnosis not present

## 2024-02-14 DIAGNOSIS — R931 Abnormal findings on diagnostic imaging of heart and coronary circulation: Secondary | ICD-10-CM

## 2024-02-14 DIAGNOSIS — I10 Essential (primary) hypertension: Secondary | ICD-10-CM

## 2024-02-14 DIAGNOSIS — I517 Cardiomegaly: Secondary | ICD-10-CM

## 2024-02-14 DIAGNOSIS — R002 Palpitations: Secondary | ICD-10-CM | POA: Diagnosis not present

## 2024-02-14 DIAGNOSIS — I480 Paroxysmal atrial fibrillation: Secondary | ICD-10-CM | POA: Diagnosis not present

## 2024-02-14 DIAGNOSIS — E785 Hyperlipidemia, unspecified: Secondary | ICD-10-CM

## 2024-02-14 DIAGNOSIS — I493 Ventricular premature depolarization: Secondary | ICD-10-CM

## 2024-02-14 MED ORDER — METOPROLOL TARTRATE 50 MG PO TABS: 50.0000 mg | ORAL_TABLET | Freq: Two times a day (BID) | ORAL | 3 refills | Status: DC

## 2024-02-14 NOTE — Patient Instructions (Addendum)
 Medication Instructions:  Stop Metoprolol Succinate 50 mg daily Start Metoprolol Tartrate 50 mg twice daily *If you need a refill on your cardiac medications before your next appointment, please call your pharmacy*   Lab Work: Fasting Lipid panel & CMET today  Testing/Procedures: NONE ordered at this time of appointment   Follow-Up: At [facility], you and your health needs are our priority.  As part of our continuing mission to provide you with exceptional heart care, we have created designated Provider Care Teams.  These Care Teams include your primary Cardiologist (physician) and Advanced Practice Providers (APPs -  Physician Assistants and Nurse Practitioners) who all work together to provide you with the care you need, when you need it.  We recommend signing up for the patient portal called "MyChart".  Sign up information is provided on this After Visit Summary.  MyChart is used to connect with patients for Virtual Visits (Telemedicine).  Patients are able to view lab/test results, encounter notes, upcoming appointments, etc.  Non-urgent messages can be sent to your provider as well.   To learn more about what you can do with MyChart, go to [email or web address].    Your next appointment:    6-8 weeks Bernadene Person NP) 4-6 months (Dr. Anne Fu)  Provider:   Dr. Anne Fu, Bernadene Person NP    Other Instructions Monitor blood pressure. Goal BP is 130/80 or less. Omron BP arm cuff recommended.    1st Floor: - Lobby - Registration  - Pharmacy  - Lab - Cafe  2nd Floor: - PV Lab - Diagnostic Testing (echo, CT, nuclear med)  3rd Floor: - Vacant  4th Floor: - TCTS (cardiothoracic surgery) - AFib Clinic - Structural Heart Clinic - Vascular Surgery  - Vascular Ultrasound  5th Floor: - [facility] Cardiology (general and EP) - Clinical Pharmacy for coumadin, hypertension, lipid, weight-loss medications, and med management appointments    Valet parking services  will be available as well.

## 2024-02-14 NOTE — Progress Notes (Signed)
 Office Visit    Patient Name: Todd Rogers Date of Encounter: 02/14/2024  Primary Care Provider:  Ardith Dark, MD Primary Cardiologist:  Donato Schultz, MD  Chief Complaint    63 year old male with a history of elevated coronary artery calcium score,  paroxysmal atrial fibrillation, PACs, PVCs, LVH, hypertension, and hyperlipidemia who presents for follow-up related to chest pain and palpitations.  Past Medical History    Past Medical History:  Diagnosis Date   Palpitations    Past Surgical History:  Procedure Laterality Date   HAND SURGERY  2009   patient stated he cut his hand, and part of a bone had to be removed.   SPERMATOCELECTOMY Bilateral 09/24/2018   Procedure: SPERMATOCELECTOMY;  Surgeon: Bjorn Pippin, MD;  Location: [facility];  Service: Urology;  Laterality: Bilateral;   WISDOM TOOTH EXTRACTION      Allergies  No Known Allergies   Labs/Other Studies Reviewed    The following studies were reviewed today:  Cardiac Studies & Procedures   ______________________________________________________________________________________________   STRESS TESTS  EXERCISE TOLERANCE TEST (ETT) 01/01/2024  Narrative   No ST deviation was noted. Arrhythmias during stress: occasional PACs, frequent PVCs. Arrhythmias during recovery: occasional PACs. The ECG was negative for ischemia, however frequent PVCs present with stress.   A Bruce protocol stress test was performed. Exercise capacity was normal. Patient exercised for 7 min and 44 sec. Maximum HR of 137 bpm. MPHR 85.0%. Peak METS 9.6. The patient experienced no angina during the test. The test was stopped because the patient experienced fatigue. The patient reported no symptoms during the stress test. Normal blood pressure and normal heart rate response noted during stress. Heart rate recovery was normal.   Prior study not available for comparison.   ECHOCARDIOGRAM  ECHOCARDIOGRAM COMPLETE  01/01/2024  Narrative ECHOCARDIOGRAM REPORT    Patient Name:   Todd Rogers Date of Exam: 01/01/2024 Medical Rec #:  [id number]       Height:       74.0 in Accession #:    [id number]      Weight:       211.0 lb Date of Birth:  1961-10-05       BSA:          2.223 m Patient Age:    62 years        BP:           138/80 mmHg Patient Gender: M               HR:           72 bpm. Exam Location:  [address]  Procedure: 2D Echo, Cardiac Doppler, Color Doppler, 3D Echo and Strain Analysis  Indications:    R07.9 Chest pain  History:        Patient has no prior history of Echocardiogram examinations. Signs/Symptoms:Chest Pain and Palpitations; Risk Factors:Hypertension and Dyslipidemia.  Sonographer:    Samule Ohm RDCS Referring Phys: [id number] JAMES HOCHREIN  IMPRESSIONS   1. Left ventricular ejection fraction, by estimation, is 55 to 60%. Left ventricular ejection fraction by 3D volume is 55 %. The left ventricle has normal function. The left ventricle has no regional wall motion abnormalities. There is moderate asymmetric left ventricular hypertrophy of the basal-septal segment. Left ventricular diastolic parameters are consistent with Grade I diastolic dysfunction (impaired relaxation). The average left ventricular global longitudinal strain is -19.0 %. The global longitudinal strain is normal. 2. Right ventricular systolic function is normal.  The right ventricular size is normal. Tricuspid regurgitation signal is inadequate for assessing PA pressure. 3. The mitral valve is grossly normal. Trivial mitral valve regurgitation. No evidence of mitral stenosis. 4. The aortic valve is tricuspid. Aortic valve regurgitation is not visualized. No aortic stenosis is present. 5. The inferior vena cava is normal in size with greater than 50% respiratory variability, suggesting right atrial pressure of 3 mmHg.  FINDINGS Left Ventricle: Left ventricular ejection fraction, by estimation, is 55 to  60%. Left ventricular ejection fraction by 3D volume is 55 %. The left ventricle has normal function. The left ventricle has no regional wall motion abnormalities. The average left ventricular global longitudinal strain is -19.0 %. The global longitudinal strain is normal. The left ventricular internal cavity size was normal in size. There is moderate asymmetric left ventricular hypertrophy of the basal-septal segment. Left ventricular diastolic parameters are consistent with Grade I diastolic dysfunction (impaired relaxation).  Right Ventricle: The right ventricular size is normal. No increase in right ventricular wall thickness. Right ventricular systolic function is normal. Tricuspid regurgitation signal is inadequate for assessing PA pressure.  Left Atrium: Left atrial size was normal in size.  Right Atrium: Right atrial size was normal in size.  Pericardium: There is no evidence of pericardial effusion.  Mitral Valve: The mitral valve is grossly normal. Trivial mitral valve regurgitation. No evidence of mitral valve stenosis.  Tricuspid Valve: The tricuspid valve is grossly normal. Tricuspid valve regurgitation is trivial. No evidence of tricuspid stenosis.  Aortic Valve: The aortic valve is tricuspid. Aortic valve regurgitation is not visualized. No aortic stenosis is present.  Pulmonic Valve: The pulmonic valve was grossly normal. Pulmonic valve regurgitation is trivial. No evidence of pulmonic stenosis.  Aorta: The aortic root and ascending aorta are structurally normal, with no evidence of dilitation.  Venous: The inferior vena cava is normal in size with greater than 50% respiratory variability, suggesting right atrial pressure of 3 mmHg.  IAS/Shunts: The atrial septum is grossly normal.   LEFT VENTRICLE PLAX 2D LVIDd:         4.75 cm         Diastology LVIDs:         2.85 cm         LV e' medial:    5.66 cm/s LV PW:         1.20 cm         LV E/e' medial:  10.5 LV IVS:         1.50 cm         LV e' lateral:   8.38 cm/s LVOT diam:     2.20 cm         LV E/e' lateral: 7.1 LV SV:         75 LV SV Index:   34              2D LVOT Area:     3.80 cm        Longitudinal Strain 2D Strain GLS  -19.0 % Avg:  3D Volume EF LV 3D EF:    Left ventricul ar ejection fraction by 3D volume is 55 %.  3D Volume EF: 3D EF:        55 % LV EDV:       167 ml LV ESV:       76 ml LV SV:        92 ml  RIGHT VENTRICLE  IVC RV S prime:     13.20 cm/s  IVC diam: 1.10 cm TAPSE (M-mode): 1.9 cm  LEFT ATRIUM             Index        RIGHT ATRIUM           Index LA diam:        3.65 cm 1.64 cm/m   RA Pressure: 3.00 mmHg LA Vol (A2C):   43.4 ml 19.52 ml/m  RA Area:     16.70 cm LA Vol (A4C):   37.1 ml 16.69 ml/m  RA Volume:   55.00 ml  24.74 ml/m LA Biplane Vol: 43.1 ml 19.38 ml/m AORTIC VALVE LVOT Vmax:   101.00 cm/s LVOT Vmean:  68.100 cm/s LVOT VTI:    0.197 m  AORTA Ao Root diam: 3.30 cm Ao Asc diam:  3.70 cm  MITRAL VALVE               TRICUSPID VALVE MV Area (PHT): 2.42 cm    Estimated RAP:  3.00 mmHg MV Decel Time: 314 msec MV E velocity: 59.20 cm/s  SHUNTS MV A velocity: 73.40 cm/s  Systemic VTI:  0.20 m MV E/A ratio:  0.81        Systemic Diam: 2.20 cm  Lennie Odor MD Electronically signed by Lennie Odor MD Signature Date/Time: 01/01/2024/1:59:14 PM    Final    MONITORS  LONG TERM MONITOR (3-14 DAYS) 11/22/2023  Narrative NSR with sinus brady (56/min) and sinus tachy (139/min), ave 82/min. PAF is present, longest 46 seconds and less than 1% burden. Frequent PAC's (9.8%) and rare PVC's (<1%). No VT or SVT or prolonged pauses No symptoms recorded Patch Wear Time:  2 days and 13 hours (2024-12-12T09:39:53-0500 to 2024-12-14T22:59:14-0500)   CT SCANS  CT CARDIAC SCORING (SELF PAY ONLY) 03/09/2022  Addendum 03/09/2022 11:50 AM ADDENDUM REPORT: 03/09/2022 11:48  EXAM: OVER-READ INTERPRETATION  CT CHEST  The following  report is an over-read performed by radiologist Dr. Nadara Eaton [facility] on 03/09/2022. This over-read does not include interpretation of cardiac or coronary anatomy or pathology. The coronary calcium score interpretation by the cardiologist is attached.  COMPARISON:  Chest radiograph 09/27/2022  FINDINGS: Visualized mediastinal structures are normal. Images of the upper abdomen are unremarkable. No airspace disease or consolidation in the visualized lungs. 4 mm nodule in the lingula on sequence 4, image 49. No acute bone abnormality.  IMPRESSION: 1. No acute extracardiac findings. 2. 4 mm nodule in the lingula is indeterminate. No follow-up needed if patient is low-risk. Non-contrast chest CT can be considered in 12 months if patient is high-risk. This recommendation follows the consensus statement: Guidelines for Management of Incidental Pulmonary Nodules Detected on CT Images: From the Fleischner Society 2017; Radiology 2017; 284:228-243.   Electronically Signed By: Richarda Overlie M.D. On: 03/09/2022 11:48  Narrative CLINICAL DATA:  Cardiovascular Disease Risk stratification  EXAM: Coronary Calcium Score  TECHNIQUE: A gated, non-contrast computed tomography scan of the heart was performed using 3mm slice thickness. Axial images were analyzed on a dedicated workstation. Calcium scoring of the coronary arteries was performed using the Agatston method.  FINDINGS: Coronary arteries: Normal origins.  Coronary Calcium Score:  Left main: 0  Left anterior descending artery: 09.28  Left circumflex artery: 0  Right coronary artery: 0  Total: 9.28  Percentile: 64th  Pericardium: Normal.  Ascending Aorta: Mildly dilated measuring 40mm at the bifurcation of the main pulmonary artery. Consider dedicated Chest CTA  or MRI/MRA to further assess. Scattered calcifications.  Non-cardiac: See separate report from [facility].  IMPRESSION: Coronary  calcium score of 9.28. This was 64th percentile for age-, race-, and sex-matched controls.  Mildly dilated measuring 40mm at the bifurcation of the main pulmonary artery. Consider dedicated Chest CTA or MRI/MRA to further assess.  RECOMMENDATIONS: Coronary artery calcium (CAC) score is a strong predictor of incident coronary heart disease (CHD) and provides predictive information beyond traditional risk factors. CAC scoring is reasonable to use in the decision to withhold, postpone, or initiate statin therapy in intermediate-risk or selected borderline-risk asymptomatic adults (age 45-75 years and LDL-C >=70 to <190 mg/dL) who do not have diabetes or established atherosclerotic cardiovascular disease (ASCVD).* In intermediate-risk (10-year ASCVD risk >=7.5% to <20%) adults or selected borderline-risk (10-year ASCVD risk >=5% to <7.5%) adults in whom a CAC score is measured for the purpose of making a treatment decision the following recommendations have been made:  If CAC=0, it is reasonable to withhold statin therapy and reassess in 5 to 10 years, as long as higher risk conditions are absent (diabetes mellitus, family history of premature CHD in first degree relatives (males <55 years; females <65 years), cigarette smoking, or LDL >=190 mg/dL).  If CAC is 1 to 99, it is reasonable to initiate statin therapy for patients >=65 years of age.  If CAC is >=100 or >=75th percentile, it is reasonable to initiate statin therapy at any age.  Cardiology referral should be considered for patients with CAC scores >=400 or >=75th percentile.  *2018 AHA/ACC/AACVPR/AAPA/ABC/ACPM/ADA/AGS/APhA/ASPC/NLA/PCNA Guideline on the Management of Blood Cholesterol: A Report of the American College of Cardiology/American Heart Association Task Force on Clinical Practice Guidelines. J Am Coll Cardiol. 2019;73(24):3168-3209.  Armanda Magic, MD  Electronically Signed: By: Armanda Magic M.D. On:  03/09/2022 10:38     ______________________________________________________________________________________________     Recent Labs: 10/29/2023: ALT 23; BUN 14; Creatinine, Ser 1.10; Hemoglobin 16.1; Magnesium 2.3; Platelets 195.0; Potassium 4.2; Sodium 140; TSH 1.38  Recent Lipid Panel    Component Value Date/Time   CHOL 199 02/01/2023 0813   TRIG 57.0 02/01/2023 0813   HDL 70.20 02/01/2023 0813   CHOLHDL 3 02/01/2023 0813   VLDL 11.4 02/01/2023 0813   LDLCALC 118 (H) 02/01/2023 0813    History of Present Illness    63 year old male with the above past medical history including elevated coronary artery calcium score,  paroxysmal atrial fibrillation, PACs, PVCs, LVH, hypertension, and hyperlipidemia.   Coronary calcium score in 02/2022 was 9.28 (64th percentile). 3-day Zio in 10/2023 revealed sinus rhythm, sinus bradycardia, paroxysmal atrial fibrillation, longest episode lasting 46 seconds, less than 1% burden, frequent PVCs, rare PVCs. Anticoagulation was deferred.  He was referred to cardiology. He last seen in the office on 12/05/2023 and reported daily palpitations.  He noted chest discomfort at night when lying down, associated with certain foods.  Echocardiogram in 12/2023 showed EF 55 to 60%, normal LV function, no RWMA, moderate asymmetric LVH of the basal septal segment, G1 DD, normal RV systolic function, no significant valvular abnormalities.  ETT in 12/2023 was negative for ischemia, he was noted to have occasional PACs, frequent PVCs.  He requested to switch from Dr. Antoine Poche to Dr. Anne Fu or a cardiologist at the [facility].  He presents today for follow-up.  Since his last visit he has been stable from a cardiac standpoint.  He continues to note intermittent "fluttering" in his chest, mostly in the evening hours, symptoms will last for 30  minutes to an hour at a time.  He denies any associated symptoms.  He has retired.  He is working on gradually increasing his activity.   Other than his ongoing palpitations, he denies any additional concerns today.  Home Medications    Current Outpatient Medications  Medication Sig Dispense Refill   ACAI BERRY PO Take by mouth.     Ascorbic Acid (VITAMIN C) 100 MG tablet Take 100 mg by mouth daily.     azelastine (ASTELIN) 0.1 % nasal spray USE 2 SPRAYS IN EACH NOSTRIL TWICE DAILY 30 mL 12   cholecalciferol (VITAMIN D3) 25 MCG (1000 UT) tablet Take 1,000 Units by mouth daily.     cyclobenzaprine (FLEXERIL) 10 MG tablet Take 1-2 tablets (10-20 mg total) by mouth at bedtime. May cause drowsiness. 30 tablet 0   diclofenac (VOLTAREN) 75 MG EC tablet Take 1 tablet (75 mg total) by mouth 2 (two) times daily. 60 tablet 1   metoprolol tartrate (LOPRESSOR) 50 MG tablet Take 1 tablet (50 mg total) by mouth 2 (two) times daily. 180 tablet 3   Multiple Vitamin (MULTIVITAMIN) tablet Take 1 tablet by mouth daily.     Olopatadine HCl (PATADAY) 0.7 % SOLN 1 drop to each eye daily 2.5 mL 0   zinc gluconate 50 MG tablet Take 50 mg by mouth daily.     No current facility-administered medications for this visit.     Review of Systems    He denies chest pain, dyspnea, pnd, orthopnea, n, v, dizziness, syncope, edema, weight gain, or early satiety. All other systems reviewed and are otherwise negative except as noted above.   Physical Exam    VS:  BP (!) 128/90 (BP Location: Left Arm, Patient Position: Sitting, Cuff Size: Normal)   Ht 6\' 2"  (1.88 m)   Wt 209 lb (94.8 kg)   SpO2 98%   BMI 26.83 kg/m  , GEN: Well nourished, well developed, in no acute distress. HEENT: normal. Neck: Supple, no JVD, carotid bruits, or masses. Cardiac: RRR, no murmurs, rubs, or gallops. No clubbing, cyanosis, edema.  Radials/DP/PT 2+ and equal bilaterally.  Respiratory:  Respirations regular and unlabored, clear to auscultation bilaterally. GI: Soft, nontender, nondistended, BS + x 4. MS: no deformity or atrophy. Skin: warm and dry, no rash. Neuro:   Strength and sensation are intact. Psych: Normal affect.  Accessory Clinical Findings    ECG personally reviewed by me today - EKG Interpretation Date/Time:  Thursday February 14 2024 08:52:00 EDT Ventricular Rate:  79 PR Interval:  156 QRS Duration:  102 QT Interval:  398 QTC Calculation: 456 R Axis:   -15  Text Interpretation: Normal sinus rhythm Incomplete right bundle branch block No previous ECGs available Confirmed by Bernadene Person ([id number]) on 02/14/2024 8:53:35 AM  - no acute changes.   Lab Results  Component Value Date   WBC 5.7 10/29/2023   HGB 16.1 10/29/2023   HCT 48.4 10/29/2023   MCV 87.2 10/29/2023   PLT 195.0 10/29/2023   Lab Results  Component Value Date   CREATININE 1.10 10/29/2023   BUN 14 10/29/2023   NA 140 10/29/2023   K 4.2 10/29/2023   CL 105 10/29/2023   CO2 28 10/29/2023   Lab Results  Component Value Date   ALT 23 10/29/2023   AST 21 10/29/2023   ALKPHOS 90 10/29/2023   BILITOT 0.5 10/29/2023   Lab Results  Component Value Date   CHOL 199 02/01/2023   HDL 70.20 02/01/2023  LDLCALC 118 (H) 02/01/2023   TRIG 57.0 02/01/2023   CHOLHDL 3 02/01/2023    Lab Results  Component Value Date   HGBA1C 5.4 02/01/2023    Assessment & Plan    1. Paroxysmal atrial fibrillation/PACs/PVCs: 3-day Zio in 10/2023 revealed sinus rhythm, sinus bradycardia, paroxysmal atrial fibrillation, longest episode lasting 46 seconds, less than 1% burden, frequent PVCs, rare PVCs. Anticoagulation was deferred.  He was noted to have frequent PVCs, occasional PACs during recent ETT.  He continues to note intermittent "fluttering" in his chest, mostly in the evening hours, symptoms will last for 30 minutes to an hour at a time.  He denies any associated symptoms.  We discussed repeat cardiac monitor, patient wishes to defer at this time. Will transition from metoprolol succinate to metoprolol tartrate 50 mg twice daily to see if his symptoms improve.  We discussed home  monitoring with Kardia mobile device, BP monitoring, goal BP less than 130/80.   2. Elevated coronary artery calcium score: Coronary calcium score in 02/2022 was 9.28 (64th percentile). ETT in 12/2023 was negative for ischemia. Stable with no anginal symptoms. No indication for ischemic evaluation.  Continue metoprolol as above, consider initiation of statin therapy as below.  3. LVH:  Echocardiogram in 12/2023 showed EF 55 to 60%, normal LV function, no RWMA, moderate asymmetric LVH of the basal septal segment, G1 DD, normal RV systolic function, no significant valvular abnormalities. Euvolemic and well compensated on exam.  Continue metoprolol.  4. Hypertension: BP mildly elevated in office today, generally well controlled. Continue metoprolol as above.  5. Hyperlipidemia: LDL was 118 in 01/2023. ASCVD risk score is 12.6.  He would likely benefit from statin therapy.  Will update fasting lipids, CMET.  Consider initiation of statin therapy pending lab results.   6. History of snoring: He reports a history of snoring, daytime sleepiness.  Given frequent ectopy, ongoing palpitations, consider need for sleep study in the future.   7. Disposition: Follow-up in 6 to 8 weeks with APP.  He has requested to transition from Dr. Antoine Poche to Dr. Anne Fu, both cardiologist agreed.  Will have him follow-up in 4 to 6 months with Dr. Anne Fu.   Joylene Grapes, NP 02/14/2024, 12:53 PM

## 2024-02-15 LAB — COMPREHENSIVE METABOLIC PANEL
ALT: 21 IU/L (ref 0–44)
AST: 21 IU/L (ref 0–40)
Albumin: 4.4 g/dL (ref 3.9–4.9)
Alkaline Phosphatase: 102 IU/L (ref 44–121)
BUN/Creatinine Ratio: 11 (ref 10–24)
BUN: 11 mg/dL (ref 8–27)
Bilirubin Total: 0.5 mg/dL (ref 0.0–1.2)
CO2: 22 mmol/L (ref 20–29)
Calcium: 9.1 mg/dL (ref 8.6–10.2)
Chloride: 105 mmol/L (ref 96–106)
Creatinine, Ser: 1.02 mg/dL (ref 0.76–1.27)
Globulin, Total: 2.1 g/dL (ref 1.5–4.5)
Glucose: 84 mg/dL (ref 70–99)
Potassium: 4.5 mmol/L (ref 3.5–5.2)
Sodium: 142 mmol/L (ref 134–144)
Total Protein: 6.5 g/dL (ref 6.0–8.5)
eGFR: 84 mL/min/{1.73_m2} (ref >59)

## 2024-02-15 LAB — LIPID PANEL
Chol/HDL Ratio: 3.1 ratio (ref 0.0–5.0)
Cholesterol, Total: 182 mg/dL (ref 100–199)
HDL: 58 mg/dL (ref >39)
LDL Chol Calc (NIH): 113 mg/dL — ABNORMAL HIGH (ref 0–99)
Triglycerides: 59 mg/dL (ref 0–149)
VLDL Cholesterol Cal: 11 mg/dL (ref 5–40)

## 2024-02-21 ENCOUNTER — Telehealth: Payer: Self-pay

## 2024-02-21 NOTE — Telephone Encounter (Signed)
Lmom to discuss lab results.

## 2024-02-22 ENCOUNTER — Telehealth: Payer: Self-pay | Admitting: Nurse Practitioner

## 2024-02-22 DIAGNOSIS — E785 Hyperlipidemia, unspecified: Secondary | ICD-10-CM

## 2024-02-22 MED ORDER — ROSUVASTATIN CALCIUM 10 MG PO TABS: 10.0000 mg | ORAL_TABLET | Freq: Every day | ORAL | 1 refills | Status: DC

## 2024-02-22 NOTE — Telephone Encounter (Signed)
 Pt returning call to a nurse for results

## 2024-02-22 NOTE — Telephone Encounter (Signed)
 Called and spoke to pt. Pt's identity verified with two identifications. Lab results reviewed with pt; also went over medication and lab recommendations. Will also send medication info through MyChart message. He verbalized understanding.    Joylene Grapes, NP  02/20/2024 12:28 PM EDT  Recent labs show stable kidney function and electrolytes, stable liver enzymes, cholesterol is elevated above LDL goal of less than 70.  Would recommend initiation of Crestor 10 mg daily.  Repeat fasting lipids, LFTs in 2 to 3 months.  Otherwise, continue current medications and follow-up as planned.  Thank you-E

## 2024-03-03 ENCOUNTER — Telehealth: Payer: Self-pay

## 2024-03-03 ENCOUNTER — Other Ambulatory Visit: Payer: Self-pay

## 2024-03-03 DIAGNOSIS — Z79899 Other long term (current) drug therapy: Secondary | ICD-10-CM

## 2024-03-03 DIAGNOSIS — E785 Hyperlipidemia, unspecified: Secondary | ICD-10-CM

## 2024-03-03 NOTE — Telephone Encounter (Signed)
 Mychart message sent to pt. Lab orders placed.

## 2024-03-15 ENCOUNTER — Other Ambulatory Visit: Payer: Self-pay | Admitting: Cardiology

## 2024-03-18 MED ORDER — METOPROLOL TARTRATE 50 MG PO TABS: 50.0000 mg | ORAL_TABLET | Freq: Two times a day (BID) | ORAL | 3 refills | Status: AC

## 2024-03-26 NOTE — Progress Notes (Unsigned)
 Office Visit    Patient Name: Todd Rogers Date of Encounter: 03/27/2024  Primary Care Provider:  Rodney Clamp, MD Primary Cardiologist:  Dorothye Gathers, MD  Chief Complaint    63 year old male with a history of elevated coronary artery calcium  score,  paroxysmal atrial fibrillation, PACs, PVCs, LVH, hypertension, and hyperlipidemia who presents for follow-up related to chest pain and palpitations.   Past Medical History    Past Medical History:  Diagnosis Date   Palpitations    Past Surgical History:  Procedure Laterality Date   HAND SURGERY  2009   patient stated he cut his hand, and part of a bone had to be removed.   SPERMATOCELECTOMY Bilateral 09/24/2018   Procedure: SPERMATOCELECTOMY;  Surgeon: Homero Luster, MD;  Location: [facility];  Service: Urology;  Laterality: Bilateral;   WISDOM TOOTH EXTRACTION      Allergies  No Known Allergies   Labs/Other Studies Reviewed    The following studies were reviewed today:  Cardiac Studies & Procedures   ______________________________________________________________________________________________   STRESS TESTS  EXERCISE TOLERANCE TEST (ETT) 01/01/2024  Narrative   No ST deviation was noted. Arrhythmias during stress: occasional PACs, frequent PVCs. Arrhythmias during recovery: occasional PACs. The ECG was negative for ischemia, however frequent PVCs present with stress.   A Bruce protocol stress test was performed. Exercise capacity was normal. Patient exercised for 7 min and 44 sec. Maximum HR of 137 bpm. MPHR 85.0%. Peak METS 9.6. The patient experienced no angina during the test. The test was stopped because the patient experienced fatigue. The patient reported no symptoms during the stress test. Normal blood pressure and normal heart rate response noted during stress. Heart rate recovery was normal.   Prior study not available for comparison.   ECHOCARDIOGRAM  ECHOCARDIOGRAM COMPLETE  01/01/2024  Narrative ECHOCARDIOGRAM REPORT    Patient Name:   Todd Rogers Date of Exam: 01/01/2024 Medical Rec #:  [id number]       Height:       74.0 in Accession #:    [id number]      Weight:       211.0 lb Date of Birth:  1961/05/10       BSA:          2.223 m Patient Age:    62 years        BP:           138/80 mmHg Patient Gender: M               HR:           72 bpm. Exam Location:  [address]  Procedure: 2D Echo, Cardiac Doppler, Color Doppler, 3D Echo and Strain Analysis  Indications:    R07.9 Chest pain  History:        Patient has no prior history of Echocardiogram examinations. Signs/Symptoms:Chest Pain and Palpitations; Risk Factors:Hypertension and Dyslipidemia.  Sonographer:    Lula Sale RDCS Referring Phys: [id number] [id number] HOCHREIN  IMPRESSIONS   1. Left ventricular ejection fraction, by estimation, is 55 to 60%. Left ventricular ejection fraction by 3D volume is 55 %. The left ventricle has normal function. The left ventricle has no regional wall motion abnormalities. There is moderate asymmetric left ventricular hypertrophy of the basal-septal segment. Left ventricular diastolic parameters are consistent with Grade I diastolic dysfunction (impaired relaxation). The average left ventricular global longitudinal strain is -19.0 %. The global longitudinal strain is normal. 2. Right ventricular systolic function is  normal. The right ventricular size is normal. Tricuspid regurgitation signal is inadequate for assessing PA pressure. 3. The mitral valve is grossly normal. Trivial mitral valve regurgitation. No evidence of mitral stenosis. 4. The aortic valve is tricuspid. Aortic valve regurgitation is not visualized. No aortic stenosis is present. 5. The inferior vena cava is normal in size with greater than 50% respiratory variability, suggesting right atrial pressure of 3 mmHg.  FINDINGS Left Ventricle: Left ventricular ejection fraction, by estimation, is 55 to  60%. Left ventricular ejection fraction by 3D volume is 55 %. The left ventricle has normal function. The left ventricle has no regional wall motion abnormalities. The average left ventricular global longitudinal strain is -19.0 %. The global longitudinal strain is normal. The left ventricular internal cavity size was normal in size. There is moderate asymmetric left ventricular hypertrophy of the basal-septal segment. Left ventricular diastolic parameters are consistent with Grade I diastolic dysfunction (impaired relaxation).  Right Ventricle: The right ventricular size is normal. No increase in right ventricular wall thickness. Right ventricular systolic function is normal. Tricuspid regurgitation signal is inadequate for assessing PA pressure.  Left Atrium: Left atrial size was normal in size.  Right Atrium: Right atrial size was normal in size.  Pericardium: There is no evidence of pericardial effusion.  Mitral Valve: The mitral valve is grossly normal. Trivial mitral valve regurgitation. No evidence of mitral valve stenosis.  Tricuspid Valve: The tricuspid valve is grossly normal. Tricuspid valve regurgitation is trivial. No evidence of tricuspid stenosis.  Aortic Valve: The aortic valve is tricuspid. Aortic valve regurgitation is not visualized. No aortic stenosis is present.  Pulmonic Valve: The pulmonic valve was grossly normal. Pulmonic valve regurgitation is trivial. No evidence of pulmonic stenosis.  Aorta: The aortic root and ascending aorta are structurally normal, with no evidence of dilitation.  Venous: The inferior vena cava is normal in size with greater than 50% respiratory variability, suggesting right atrial pressure of 3 mmHg.  IAS/Shunts: The atrial septum is grossly normal.   LEFT VENTRICLE PLAX 2D LVIDd:         4.75 cm         Diastology LVIDs:         2.85 cm         LV e' medial:    5.66 cm/s LV PW:         1.20 cm         LV E/e' medial:  10.5 LV IVS:         1.50 cm         LV e' lateral:   8.38 cm/s LVOT diam:     2.20 cm         LV E/e' lateral: 7.1 LV SV:         75 LV SV Index:   34              2D LVOT Area:     3.80 cm        Longitudinal Strain 2D Strain GLS  -19.0 % Avg:  3D Volume EF LV 3D EF:    Left ventricul ar ejection fraction by 3D volume is 55 %.  3D Volume EF: 3D EF:        55 % LV EDV:       167 ml LV ESV:       76 ml LV SV:        92 ml  RIGHT VENTRICLE  IVC RV S prime:     13.20 cm/s  IVC diam: 1.10 cm TAPSE (M-mode): 1.9 cm  LEFT ATRIUM             Index        RIGHT ATRIUM           Index LA diam:        3.65 cm 1.64 cm/m   RA Pressure: 3.00 mmHg LA Vol (A2C):   43.4 ml 19.52 ml/m  RA Area:     16.70 cm LA Vol (A4C):   37.1 ml 16.69 ml/m  RA Volume:   55.00 ml  24.74 ml/m LA Biplane Vol: 43.1 ml 19.38 ml/m AORTIC VALVE LVOT Vmax:   101.00 cm/s LVOT Vmean:  68.100 cm/s LVOT VTI:    0.197 m  AORTA Ao Root diam: 3.30 cm Ao Asc diam:  3.70 cm  MITRAL VALVE               TRICUSPID VALVE MV Area (PHT): 2.42 cm    Estimated RAP:  3.00 mmHg MV Decel Time: 314 msec MV E velocity: 59.20 cm/s  SHUNTS MV A velocity: 73.40 cm/s  Systemic VTI:  0.20 m MV E/A ratio:  0.81        Systemic Diam: 2.20 cm  Jackquelyn Mass MD Electronically signed by Jackquelyn Mass MD Signature Date/Time: 01/01/2024/1:59:14 PM    Final    MONITORS  LONG TERM MONITOR (3-14 DAYS) 11/22/2023  Narrative NSR with sinus brady (56/min) and sinus tachy (139/min), ave 82/min. PAF is present, longest 46 seconds and less than 1% burden. Frequent PAC's (9.8%) and rare PVC's (<1%). No VT or SVT or prolonged pauses No symptoms recorded Patch Wear Time:  2 days and 13 hours (2024-12-12T09:39:53-0500 to 2024-12-14T22:59:14-0500)   CT SCANS  CT CARDIAC SCORING (SELF PAY ONLY) 03/09/2022  Addendum 03/09/2022 11:50 AM ADDENDUM REPORT: 03/09/2022 11:48  EXAM: OVER-READ INTERPRETATION  CT CHEST  The following  report is an over-read performed by radiologist Dr. Nicholos Barlow [facility] on 03/09/2022. This over-read does not include interpretation of cardiac or coronary anatomy or pathology. The coronary calcium  score interpretation by the cardiologist is attached.  COMPARISON:  Chest radiograph 09/27/2022  FINDINGS: Visualized mediastinal structures are normal. Images of the upper abdomen are unremarkable. No airspace disease or consolidation in the visualized lungs. 4 mm nodule in the lingula on sequence 4, image 49. No acute bone abnormality.  IMPRESSION: 1. No acute extracardiac findings. 2. 4 mm nodule in the lingula is indeterminate. No follow-up needed if patient is low-risk. Non-contrast chest CT can be considered in 12 months if patient is high-risk. This recommendation follows the consensus statement: Guidelines for Management of Incidental Pulmonary Nodules Detected on CT Images: From the Fleischner Society 2017; Radiology 2017; 284:228-243.   Electronically Signed By: Elene Griffes M.D. On: 03/09/2022 11:48  Narrative CLINICAL DATA:  Cardiovascular Disease Risk stratification  EXAM: Coronary Calcium  Score  TECHNIQUE: A gated, non-contrast computed tomography scan of the heart was performed using 3mm slice thickness. Axial images were analyzed on a dedicated workstation. Calcium  scoring of the coronary arteries was performed using the Agatston method.  FINDINGS: Coronary arteries: Normal origins.  Coronary Calcium  Score:  Left main: 0  Left anterior descending artery: 09.28  Left circumflex artery: 0  Right coronary artery: 0  Total: 9.28  Percentile: 64th  Pericardium: Normal.  Ascending Aorta: Mildly dilated measuring 40mm at the bifurcation of the main pulmonary artery. Consider dedicated Chest CTA  or MRI/MRA to further assess. Scattered calcifications.  Non-cardiac: See separate report from [facility].  IMPRESSION: Coronary  calcium  score of 9.28. This was 64th percentile for age-, race-, and sex-matched controls.  Mildly dilated measuring 40mm at the bifurcation of the main pulmonary artery. Consider dedicated Chest CTA or MRI/MRA to further assess.  RECOMMENDATIONS: Coronary artery calcium  (CAC) score is a strong predictor of incident coronary heart disease (CHD) and provides predictive information beyond traditional risk factors. CAC scoring is reasonable to use in the decision to withhold, postpone, or initiate statin therapy in intermediate-risk or selected borderline-risk asymptomatic adults (age 32-75 years and LDL-C >=70 to <190 mg/dL) who do not have diabetes or established atherosclerotic cardiovascular disease (ASCVD).* In intermediate-risk (10-year ASCVD risk >=7.5% to <20%) adults or selected borderline-risk (10-year ASCVD risk >=5% to <7.5%) adults in whom a CAC score is measured for the purpose of making a treatment decision the following recommendations have been made:  If CAC=0, it is reasonable to withhold statin therapy and reassess in 5 to 10 years, as long as higher risk conditions are absent (diabetes mellitus, family history of premature CHD in first degree relatives (males <55 years; females <65 years), cigarette smoking, or LDL >=190 mg/dL).  If CAC is 1 to 99, it is reasonable to initiate statin therapy for patients >=43 years of age.  If CAC is >=100 or >=75th percentile, it is reasonable to initiate statin therapy at any age.  Cardiology referral should be considered for patients with CAC scores >=400 or >=75th percentile.  *2018 AHA/ACC/AACVPR/AAPA/ABC/ACPM/ADA/AGS/APhA/ASPC/NLA/PCNA Guideline on the Management of Blood Cholesterol: A Report of the American College of Cardiology/American Heart Association Task Force on Clinical Practice Guidelines. J Am Coll Cardiol. 2019;73(24):3168-3209.  Gaylyn Keas, MD  Electronically Signed: By: Gaylyn Keas M.D. On:  03/09/2022 10:38     ______________________________________________________________________________________________     Recent Labs: 10/29/2023: Hemoglobin 16.1; Magnesium 2.3; Platelets 195.0; TSH 1.38 02/15/2024: ALT 21; BUN 11; Creatinine, Ser 1.02; Potassium 4.5; Sodium 142  Recent Lipid Panel    Component Value Date/Time   CHOL 182 02/15/2024 0830   TRIG 59 02/15/2024 0830   HDL 58 02/15/2024 0830   CHOLHDL 3.1 02/15/2024 0830   CHOLHDL 3 02/01/2023 0813   VLDL 11.4 02/01/2023 0813   LDLCALC 113 (H) 02/15/2024 0830    History of Present Illness    63 year old male with the above past medical history including elevated coronary artery calcium  score,  paroxysmal atrial fibrillation, PACs, PVCs, LVH, hypertension, and hyperlipidemia.    Coronary calcium  score in 02/2022 was 9.28 (64th percentile). 3-day Zio in 10/2023 revealed sinus rhythm, sinus bradycardia, paroxysmal atrial fibrillation, longest episode lasting 46 seconds, less than 1% burden, frequent PVCs, rare PVCs. Anticoagulation was deferred.  He was referred to cardiology.  He was seen in the office in January 2025 and reported daily palpitations.  He noted chest discomfort at night when lying down, associated with certain foods.  Echocardiogram in 12/2023 showed EF 55 to 60%, normal LV function, no RWMA, moderate asymmetric LVH of the basal septal segment, G1 DD, normal RV systolic function, no significant valvular abnormalities.  ETT in 12/2023 was negative for ischemia, he was noted to have occasional PACs, frequent PVCs.  He requested to switch from Dr. Lavonne Prairie to Dr. Renna Cary or a cardiologist at the [facility].  Was last seen in the office on 02/14/2024 and noted intermittent fluttering in his chest, mostly in the evening hours, lasting from 30 minutes to an hour at  a time.  He declined repeat cardiac monitor.  He was transitioned from metoprolol  succinate to metoprolol  tartrate.   He presents today for follow-up.  Since  his last visit he has done well from a cardiac standpoint.  He has noticed significant improvement in his palpitations with the transition from metoprolol  succinate to metoprolol  tartrate.  BP has remained somewhat elevated.  He remains active, he has been working on remodeling his home.  He denies symptoms concerning for angina.  Overall, he reports feeling well.  Home Medications    Current Outpatient Medications  Medication Sig Dispense Refill   Ascorbic Acid (VITAMIN C) 100 MG tablet Take 100 mg by mouth. 3 - 4 times weekly, mainly during winter months.     azelastine  (ASTELIN ) 0.1 % nasal spray USE 2 SPRAYS IN EACH NOSTRIL TWICE DAILY 30 mL 12   cholecalciferol (VITAMIN D3) 25 MCG (1000 UT) tablet Take 1,000 Units by mouth daily.     metoprolol  tartrate (LOPRESSOR ) 50 MG tablet Take 1 tablet (50 mg total) by mouth 2 (two) times daily. 180 tablet 3   Multiple Vitamin (MULTIVITAMIN) tablet Take 1 tablet by mouth daily.     Olopatadine  HCl (PATADAY ) 0.7 % SOLN 1 drop to each eye daily (Patient taking differently: 1 drop to each eye as needed.) 2.5 mL 0   rosuvastatin  (CRESTOR ) 10 MG tablet Take 1 tablet (10 mg total) by mouth daily. 90 tablet 1   zinc gluconate 50 MG tablet Take 50 mg by mouth. 3 wkly.     No current facility-administered medications for this visit.     Review of Systems    He denies chest pain, palpitations, dyspnea, pnd, orthopnea, n, v, dizziness, syncope, edema, weight gain, or early satiety. All other systems reviewed and are otherwise negative except as noted above.   Physical Exam    VS:  BP (!) 138/98   Pulse 63   Ht 6\' 2"  (1.88 m)   Wt 210 lb 3.2 oz (95.3 kg)   SpO2 94%   BMI 26.99 kg/m   GEN: Well nourished, well developed, in no acute distress. HEENT: normal. Neck: Supple, no JVD, carotid bruits, or masses. Cardiac: RRR, no murmurs, rubs, or gallops. No clubbing, cyanosis, edema.  Radials/DP/PT 2+ and equal bilaterally.  Respiratory:  Respirations  regular and unlabored, clear to auscultation bilaterally. GI: Soft, nontender, nondistended, BS + x 4. MS: no deformity or atrophy. Skin: warm and dry, no rash. Neuro:  Strength and sensation are intact. Psych: Normal affect.  Accessory Clinical Findings    ECG personally reviewed by me today - EKG Interpretation Date/Time:  Thursday Mar 27 2024 09:33:09 EDT Ventricular Rate:  63 PR Interval:  174 QRS Duration:  102 QT Interval:  446 QTC Calculation: 456 R Axis:   -7  Text Interpretation: Normal sinus rhythm Incomplete right bundle branch block When compared with ECG of 14-Feb-2024 08:52, No significant change was found Confirmed by Marlana Silvan ([id number]) on 03/27/2024 9:52:26 AM  - no acute changes.   Lab Results  Component Value Date   WBC 5.7 10/29/2023   HGB 16.1 10/29/2023   HCT 48.4 10/29/2023   MCV 87.2 10/29/2023   PLT 195.0 10/29/2023   Lab Results  Component Value Date   CREATININE 1.02 02/15/2024   BUN 11 02/15/2024   NA 142 02/15/2024   K 4.5 02/15/2024   CL 105 02/15/2024   CO2 22 02/15/2024   Lab Results  Component Value Date   ALT 21  02/15/2024   AST 21 02/15/2024   ALKPHOS 102 02/15/2024   BILITOT 0.5 02/15/2024   Lab Results  Component Value Date   CHOL 182 02/15/2024   HDL 58 02/15/2024   LDLCALC 113 (H) 02/15/2024   TRIG 59 02/15/2024   CHOLHDL 3.1 02/15/2024    Lab Results  Component Value Date   HGBA1C 5.4 02/01/2023    Assessment & Plan   1. Paroxysmal atrial fibrillation/PACs/PVCs: 3-day Zio in 10/2023 revealed sinus rhythm, sinus bradycardia, paroxysmal atrial fibrillation, longest episode lasting 46 seconds, less than 1% burden, frequent PVCs, rare PVCs. Anticoagulation was deferred.  He was noted to have frequent PVCs, occasional PACs during ETT in 12/2023.  He was transitioned from metoprolol  succinate to metoprolol  tartrate with significant improvement in his symptoms.  He denies any recent palpitations.  Continue metoprolol .    2.  Elevated coronary artery calcium  score: Coronary calcium  score in 02/2022 was 9.28 (64th percentile). ETT in 12/2023 was negative for ischemia. Stable with no anginal symptoms. No indication for ischemic evaluation.  Continue metoprolol , Crestor .   3. LVH:  Echocardiogram in 12/2023 showed EF 55 to 60%, normal LV function, no RWMA, moderate asymmetric LVH of the basal septal segment, G1 DD, normal RV systolic function, no significant valvular abnormalities. Euvolemic and well compensated on exam.  Continue metoprolol .   4. Hypertension: BP remains mildly elevated. We discussed possible transition to carvedilol, he prefers to stay on metoprolol  at this time.  He previously took amlodipine  and tolerated this well.  Will resume amlodipine  5 mg daily.  Continue to monitor BP report BP consistently 130/80, SBP consistently less than 100. Continue metoprolol .   5. Hyperlipidemia: LDL was 118 in 01/2023.  Recently started on Crestor . He is due for repeat fasting lipids, LFTs in approximately 1 month.  Continue Crestor .  6. History of snoring: He reports a history of snoring, daytime sleepiness.  Overall improved with decreased palpitations.  He declines sleep study at this time.   7. Disposition: Follow-up in 2 months.  HYPERTENSION CONTROL Vitals:   03/27/24 0937 03/27/24 1015  BP: (!) 136/94 (!) 138/98    The patient's blood pressure is elevated above target today.  In order to address the patient's elevated BP: Blood pressure will be monitored at home to determine if medication changes need to be made.; A new medication was prescribed today.; Follow up with general cardiology has been recommended.      Jude Norton, NP 03/27/2024, 10:17 AM

## 2024-03-27 ENCOUNTER — Encounter: Payer: Self-pay | Admitting: Nurse Practitioner

## 2024-03-27 ENCOUNTER — Ambulatory Visit: Attending: Nurse Practitioner | Admitting: Nurse Practitioner

## 2024-03-27 VITALS — BP 138/98 | HR 63 | Ht 74.0 in | Wt 210.2 lb

## 2024-03-27 DIAGNOSIS — R931 Abnormal findings on diagnostic imaging of heart and coronary circulation: Secondary | ICD-10-CM | POA: Diagnosis not present

## 2024-03-27 DIAGNOSIS — I493 Ventricular premature depolarization: Secondary | ICD-10-CM | POA: Diagnosis not present

## 2024-03-27 DIAGNOSIS — I491 Atrial premature depolarization: Secondary | ICD-10-CM

## 2024-03-27 DIAGNOSIS — I480 Paroxysmal atrial fibrillation: Secondary | ICD-10-CM | POA: Diagnosis not present

## 2024-03-27 DIAGNOSIS — I10 Essential (primary) hypertension: Secondary | ICD-10-CM

## 2024-03-27 DIAGNOSIS — E785 Hyperlipidemia, unspecified: Secondary | ICD-10-CM

## 2024-03-27 DIAGNOSIS — I517 Cardiomegaly: Secondary | ICD-10-CM

## 2024-03-27 MED ORDER — AMLODIPINE BESYLATE 5 MG PO TABS: 5.0000 mg | ORAL_TABLET | Freq: Every day | ORAL | 3 refills | Status: DC

## 2024-03-27 NOTE — Patient Instructions (Addendum)
 Medication Instructions:  Start Amlodipine  5 mg daily.  *If you need a refill on your cardiac medications before your next appointment, please call your pharmacy*  Lab Work: Complete fasting lab work previously ordered in 1 month.  Testing/Procedures: NONE ordered at this time of appointment    Follow-Up: At [facility], you and your health needs are our priority.  As part of our continuing mission to provide you with exceptional heart care, our providers are all part of one team.  This team includes your primary Cardiologist (physician) and Advanced Practice Providers or APPs (Physician Assistants and Nurse Practitioners) who all work together to provide you with the care you need, when you need it.  Your next appointment:   2 month(s)  Provider:   Marlana Silvan, NP          We recommend signing up for the patient portal called "MyChart".  Sign up information is provided on this After Visit Summary.  MyChart is used to connect with patients for Virtual Visits (Telemedicine).  Patients are able to view lab/test results, encounter notes, upcoming appointments, etc.  Non-urgent messages can be sent to your provider as well.   To learn more about what you can do with MyChart, go to [email or web address].   Other Instructions Goal blood pressure is 130/80. Report systolic BP (top number) consistently less than 100.

## 2024-03-28 ENCOUNTER — Ambulatory Visit: Admitting: Cardiology

## 2024-03-29 ENCOUNTER — Encounter: Payer: Self-pay | Admitting: Nurse Practitioner

## 2024-04-08 ENCOUNTER — Encounter: Payer: Self-pay | Admitting: Family Medicine

## 2024-04-08 ENCOUNTER — Ambulatory Visit (INDEPENDENT_AMBULATORY_CARE_PROVIDER_SITE_OTHER): Admitting: Family Medicine

## 2024-04-08 VITALS — BP 113/80 | HR 81 | Temp 97.7°F | Ht 74.0 in | Wt 209.2 lb

## 2024-04-08 DIAGNOSIS — I10 Essential (primary) hypertension: Secondary | ICD-10-CM

## 2024-04-08 DIAGNOSIS — R972 Elevated prostate specific antigen [PSA]: Secondary | ICD-10-CM | POA: Diagnosis not present

## 2024-04-08 DIAGNOSIS — E538 Deficiency of other specified B group vitamins: Secondary | ICD-10-CM

## 2024-04-08 DIAGNOSIS — R7989 Other specified abnormal findings of blood chemistry: Secondary | ICD-10-CM

## 2024-04-08 DIAGNOSIS — Z0001 Encounter for general adult medical examination with abnormal findings: Secondary | ICD-10-CM

## 2024-04-08 DIAGNOSIS — J302 Other seasonal allergic rhinitis: Secondary | ICD-10-CM

## 2024-04-08 DIAGNOSIS — R911 Solitary pulmonary nodule: Secondary | ICD-10-CM

## 2024-04-08 DIAGNOSIS — I480 Paroxysmal atrial fibrillation: Secondary | ICD-10-CM

## 2024-04-08 DIAGNOSIS — Z131 Encounter for screening for diabetes mellitus: Secondary | ICD-10-CM

## 2024-04-08 DIAGNOSIS — Z1211 Encounter for screening for malignant neoplasm of colon: Secondary | ICD-10-CM

## 2024-04-08 MED ORDER — FEXOFENADINE HCL 180 MG PO TABS: 180.0000 mg | ORAL_TABLET | Freq: Every day | ORAL | 3 refills | Status: AC

## 2024-04-08 NOTE — Assessment & Plan Note (Signed)
 He has established with cardiology since our last visit.  Now on amlodipine  5 mg daily and metoprolol  tartrate 50 mg twice daily.  Blood pressure is at goal today.  Tolerating regimen well without side effects.

## 2024-04-08 NOTE — Assessment & Plan Note (Signed)
 Incidentally found during his cardiac CT screen a couple of years ago.  I recommended repeat CT scan last year.  Will order today.

## 2024-04-08 NOTE — Assessment & Plan Note (Signed)
 Worsened recently though overall stable on Allegra  and Astelin .  Will refill today.

## 2024-04-08 NOTE — Assessment & Plan Note (Signed)
Check vitamin D with labs.

## 2024-04-08 NOTE — Assessment & Plan Note (Signed)
 Regular rate and rhythm today.  Follows with cardiology.  Rate controlled on metoprolol .  Not on anticoagulation.

## 2024-04-08 NOTE — Assessment & Plan Note (Signed)
Check PSA with labs. 

## 2024-04-08 NOTE — Patient Instructions (Signed)
 It was very nice to see you today!  We will check labs.  Will refer for colonoscopy.  Will repeat your lung scan for the pulmonary nodule.  Please continue to work on diet and exercise.  Will see you back next year for your next physical.  Come back sooner if needed.  Return in about 1 year (around 04/08/2025) for Annual Physical.   Take care, Dr Daneil Dunker  PLEASE NOTE:  If you had any lab tests, please let us  know if you have not heard back within a few days. You may see your results on mychart before we have a chance to review them but we will give you a call once they are reviewed by us .   If we ordered any referrals today, please let us  know if you have not heard from their office within the next week.   If you had any urgent prescriptions sent in today, please check with the pharmacy within an hour of our visit to make sure the prescription was transmitted appropriately.   Please try these tips to maintain a healthy lifestyle:  Eat at least 3 REAL meals and 1-2 snacks per day.  Aim for no more than 5 hours between eating.  If you eat breakfast, please do so within one hour of getting up.   Each meal should contain half fruits/vegetables, one quarter protein, and one quarter carbs (no bigger than a computer mouse)  Cut down on sweet beverages. This includes juice, soda, and sweet tea.   Drink at least 1 glass of water with each meal and aim for at least 8 glasses per day  Exercise at least 150 minutes every week.    Preventive Care 88-65 Years Old, Male Preventive care refers to lifestyle choices and visits with your health care provider that can promote health and wellness. Preventive care visits are also called wellness exams. What can I expect for my preventive care visit? Counseling During your preventive care visit, your health care provider may ask about your: Medical history, including: Past medical problems. Family medical history. Current health,  including: Emotional well-being. Home life and relationship well-being. Sexual activity. Lifestyle, including: Alcohol, nicotine or tobacco, and drug use. Access to firearms. Diet, exercise, and sleep habits. Safety issues such as seatbelt and bike helmet use. Sunscreen use. Work and work Astronomer. Physical exam Your health care provider will check your: Height and weight. These may be used to calculate your BMI (body mass index). BMI is a measurement that tells if you are at a healthy weight. Waist circumference. This measures the distance around your waistline. This measurement also tells if you are at a healthy weight and may help predict your risk of certain diseases, such as type 2 diabetes and high blood pressure. Heart rate and blood pressure. Body temperature. Skin for abnormal spots. What immunizations do I need?  Vaccines are usually given at various ages, according to a schedule. Your health care provider will recommend vaccines for you based on your age, medical history, and lifestyle or other factors, such as travel or where you work. What tests do I need? Screening Your health care provider may recommend screening tests for certain conditions. This may include: Lipid and cholesterol levels. Diabetes screening. This is done by checking your blood sugar (glucose) after you have not eaten for a while (fasting). Hepatitis B test. Hepatitis C test. HIV (human immunodeficiency virus) test. STI (sexually transmitted infection) testing, if you are at risk. Lung cancer screening. Prostate cancer screening.  Colorectal cancer screening. Talk with your health care provider about your test results, treatment options, and if necessary, the need for more tests. Follow these instructions at home: Eating and drinking  Eat a diet that includes fresh fruits and vegetables, whole grains, lean protein, and low-fat dairy products. Take vitamin and mineral supplements as recommended  by your health care provider. Do not drink alcohol if your health care provider tells you not to drink. If you drink alcohol: Limit how much you have to 0-2 drinks a day. Know how much alcohol is in your drink. In the [place] one drink equals one 12 oz bottle of beer (355 mL), one 5 oz glass of wine (148 mL), or one 1 oz glass of hard liquor (44 mL). Lifestyle Brush your teeth every morning and night with fluoride toothpaste. Floss one time each day. Exercise for at least 30 minutes 5 or more days each week. Do not use any products that contain nicotine or tobacco. These products include cigarettes, chewing tobacco, and vaping devices, such as e-cigarettes. If you need help quitting, ask your health care provider. Do not use drugs. If you are sexually active, practice safe sex. Use a condom or other form of protection to prevent STIs. Take aspirin only as told by your health care provider. Make sure that you understand how much to take and what form to take. Work with your health care provider to find out whether it is safe and beneficial for you to take aspirin daily. Find healthy ways to manage stress, such as: Meditation, yoga, or listening to music. Journaling. Talking to a trusted person. Spending time with friends and family. Minimize exposure to UV radiation to reduce your risk of skin cancer. Safety Always wear your seat belt while driving or riding in a vehicle. Do not drive: If you have been drinking alcohol. Do not ride with someone who has been drinking. When you are tired or distracted. While texting. If you have been using any mind-altering substances or drugs. Wear a helmet and other protective equipment during sports activities. If you have firearms in your house, make sure you follow all gun safety procedures. What's next? Go to your health care provider once a year for an annual wellness visit. Ask your health care provider how often you should have your eyes and teeth  checked. Stay up to date on all vaccines. This information is not intended to replace advice given to you by your health care provider. Make sure you discuss any questions you have with your health care provider. Document Revised: 05/11/2021 Document Reviewed: 05/11/2021 Elsevier Patient Education  2024 [employer].

## 2024-04-08 NOTE — Progress Notes (Signed)
 Chief Complaint:  Todd Rogers is a 63 y.o. male who presents today for his annual comprehensive physical exam.    Assessment/Plan:  Chronic Problems Addressed Today: Essential hypertension He has established with cardiology since our last visit.  Now on amlodipine  5 mg daily and metoprolol  tartrate 50 mg twice daily.  Blood pressure is at goal today.  Tolerating regimen well without side effects.  Elevated PSA Check PSA with labs.  Seasonal allergies Worsened recently though overall stable on Allegra  and Astelin .  Will refill today.  Low vitamin D  level Check vitamin D  with labs.  Pulmonary nodule Incidentally found during his cardiac CT screen a couple of years ago.  I recommended repeat CT scan last year.  Will order today.  PAF (paroxysmal atrial fibrillation) (HCC) Regular rate and rhythm today.  Follows with cardiology.  Rate controlled on metoprolol .  Not on anticoagulation.  Preventative Healthcare: Check labs.  Due for colonoscopy.  Will place referral today.  Patient Counseling(The following topics were reviewed and/or handout was given):  -Nutrition: Stressed importance of moderation in sodium/caffeine intake, saturated fat and cholesterol, caloric balance, sufficient intake of fresh fruits, vegetables, and fiber.  -Stressed the importance of regular exercise.   -Substance Abuse: Discussed cessation/primary prevention of tobacco, alcohol, or other drug use; driving or other dangerous activities under the influence; availability of treatment for abuse.   -Injury prevention: Discussed safety belts, safety helmets, smoke detector, smoking near bedding or upholstery.   -Sexuality: Discussed sexually transmitted diseases, partner selection, use of condoms, avoidance of unintended pregnancy and contraceptive alternatives.   -Dental health: Discussed importance of regular tooth brushing, flossing, and dental visits.  -Health maintenance and immunizations reviewed. Please  refer to Health maintenance section.  Return to care in 1 year for next preventative visit.     Subjective:  HPI:  He has no acute complaints today. Patient here today for annual physical. He was last here about 6 months ago. At that time he was having some palpitations. We ordered a Holter (cardiac rhythm) monitor which showed atrial fibrillation. We referred him to cardiology. They started him metoprolol  and crestor .  He has tolerated these well.  No significant side effects.  He has not had any recurrence of palpitations.  Lifestyle Diet: Balanced. Plenty of fruits and vegetables.  Exercise: None specific.      04/08/2024    8:57 AM  Depression screen PHQ 2/9  Decreased Interest 0  Down, Depressed, Hopeless 0  PHQ - 2 Score 0    Health Maintenance Due  Topic Date Due   Colonoscopy  12/30/2023     ROS: Per HPI, otherwise a complete review of systems was negative.   PMH:  The following were reviewed and entered/updated in epic: Past Medical History:  Diagnosis Date   Palpitations    Patient Active Problem List   Diagnosis Date Noted   Palpitations 12/04/2023   PAF (paroxysmal atrial fibrillation) (HCC) 11/26/2023   Cervical radiculopathy 07/25/2022   Pulmonary nodule 03/16/2022   Coronary artery calcification 03/16/2022   Seasonal allergies 01/20/2022   Low vitamin D  level 02/26/2019   Essential hypertension 02/26/2019   Elevated PSA 07/23/2018   Spermatocele of epididymis, multiple 07/23/2018   Past Surgical History:  Procedure Laterality Date   HAND SURGERY  2009   patient stated he cut his hand, and part of a bone had to be removed.   SPERMATOCELECTOMY Bilateral 09/24/2018   Procedure: SPERMATOCELECTOMY;  Surgeon: Homero Luster, MD;  Location: [facility];  Service: Urology;  Laterality: Bilateral;   WISDOM TOOTH EXTRACTION      Family History  Problem Relation Age of Onset   Hypertension Mother    Diabetes Father    Kidney failure Father     Cancer Daughter        breast    Medications- reviewed and updated Current Outpatient Medications  Medication Sig Dispense Refill   amLODipine  (NORVASC ) 5 MG tablet Take 1 tablet (5 mg total) by mouth daily. 90 tablet 3   Ascorbic Acid (VITAMIN C) 100 MG tablet Take 100 mg by mouth. 3 - 4 times weekly, mainly during winter months.     azelastine  (ASTELIN ) 0.1 % nasal spray USE 2 SPRAYS IN EACH NOSTRIL TWICE DAILY 30 mL 12   cholecalciferol (VITAMIN D3) 25 MCG (1000 UT) tablet Take 1,000 Units by mouth daily.     fexofenadine  (ALLEGRA  ALLERGY) 180 MG tablet Take 1 tablet (180 mg total) by mouth daily. 90 tablet 3   metoprolol  tartrate (LOPRESSOR ) 50 MG tablet Take 1 tablet (50 mg total) by mouth 2 (two) times daily. 180 tablet 3   Multiple Vitamin (MULTIVITAMIN) tablet Take 1 tablet by mouth daily.     Olopatadine  HCl (PATADAY ) 0.7 % SOLN 1 drop to each eye daily (Patient taking differently: 1 drop to each eye as needed.) 2.5 mL 0   rosuvastatin  (CRESTOR ) 10 MG tablet Take 1 tablet (10 mg total) by mouth daily. 90 tablet 1   zinc gluconate 50 MG tablet Take 50 mg by mouth. 3 wkly.     No current facility-administered medications for this visit.    Allergies-reviewed and updated No Known Allergies  Social History   Socioeconomic History   Marital status: Married    Spouse name: Not on file   Number of children: Not on file   Years of education: Not on file   Highest education level: Not on file  Occupational History   Not on file  Tobacco Use   Smoking status: Never   Smokeless tobacco: Never  Vaping Use   Vaping status: Never Used  Substance and Sexual Activity   Alcohol use: No   Drug use: No   Sexual activity: Yes  Other Topics Concern   Not on file  Social History Narrative   Theatre stage manager.     Social Drivers of Corporate investment banker Strain: Not on file  Food Insecurity: Not on file  Transportation Needs: Not on file  Physical Activity: Not on file   Stress: Not on file  Social Connections: Not on file        Objective:  Physical Exam: BP 113/80   Pulse 81   Temp 97.7 F (36.5 C) (Temporal)   Ht 6\' 2"  (1.88 m)   Wt 209 lb 3.2 oz (94.9 kg)   SpO2 99%   BMI 26.86 kg/m   Body mass index is 26.86 kg/m. Wt Readings from Last 3 Encounters:  04/08/24 209 lb 3.2 oz (94.9 kg)  03/27/24 210 lb 3.2 oz (95.3 kg)  02/14/24 209 lb (94.8 kg)   Gen: NAD, resting comfortably HEENT: TMs normal bilaterally. OP clear. No thyromegaly noted.  CV: RRR with no murmurs appreciated Pulm: NWOB, CTAB with no crackles, wheezes, or rhonchi GI: Normal bowel sounds present. Soft, Nontender, Nondistended. MSK: no edema, cyanosis, or clubbing noted Skin: warm, dry Neuro: CN2-12 grossly intact. Strength 5/5 in upper and lower extremities. Reflexes symmetric and intact bilaterally.  Psych: Normal affect  and thought content     Julaine Zimny M. Daneil Dunker, MD 04/08/2024 10:12 AM

## 2024-04-11 ENCOUNTER — Encounter: Admitting: Family Medicine

## 2024-04-17 LAB — HEPATIC FUNCTION PANEL
ALT: 31 IU/L (ref 0–44)
AST: 26 IU/L (ref 0–40)
Albumin: 4.3 g/dL (ref 3.9–4.9)
Alkaline Phosphatase: 115 IU/L (ref 44–121)
Bilirubin Total: 0.4 mg/dL (ref 0.0–1.2)
Bilirubin, Direct: 0.13 mg/dL (ref 0.00–0.40)
Total Protein: 6.7 g/dL (ref 6.0–8.5)

## 2024-04-17 LAB — LIPID PANEL
Chol/HDL Ratio: 2.4 ratio (ref 0.0–5.0)
Cholesterol, Total: 136 mg/dL (ref 100–199)
HDL: 57 mg/dL (ref >39)
LDL Chol Calc (NIH): 67 mg/dL (ref 0–99)
Triglycerides: 57 mg/dL (ref 0–149)
VLDL Cholesterol Cal: 12 mg/dL (ref 5–40)

## 2024-06-06 ENCOUNTER — Ambulatory Visit: Attending: Nurse Practitioner | Admitting: Nurse Practitioner

## 2024-06-06 ENCOUNTER — Encounter: Payer: Self-pay | Admitting: Nurse Practitioner

## 2024-06-06 VITALS — BP 122/82 | HR 91 | Ht 74.0 in | Wt 206.4 lb

## 2024-06-06 DIAGNOSIS — Z0001 Encounter for general adult medical examination with abnormal findings: Secondary | ICD-10-CM

## 2024-06-06 DIAGNOSIS — I491 Atrial premature depolarization: Secondary | ICD-10-CM

## 2024-06-06 DIAGNOSIS — I493 Ventricular premature depolarization: Secondary | ICD-10-CM | POA: Diagnosis not present

## 2024-06-06 DIAGNOSIS — R931 Abnormal findings on diagnostic imaging of heart and coronary circulation: Secondary | ICD-10-CM

## 2024-06-06 DIAGNOSIS — I517 Cardiomegaly: Secondary | ICD-10-CM

## 2024-06-06 DIAGNOSIS — I480 Paroxysmal atrial fibrillation: Secondary | ICD-10-CM

## 2024-06-06 DIAGNOSIS — I10 Essential (primary) hypertension: Secondary | ICD-10-CM

## 2024-06-06 DIAGNOSIS — E785 Hyperlipidemia, unspecified: Secondary | ICD-10-CM

## 2024-06-06 NOTE — Progress Notes (Signed)
 Office Visit    Patient Name: Todd Rogers Date of Encounter: 06/06/2024  Primary Care Provider:  Kennyth Worth HERO, MD Primary Cardiologist:  Oneil Parchment, MD  Chief Complaint    63 year old male with a history of elevated coronary artery calcium  score,  paroxysmal atrial fibrillation, PACs, PVCs, LVH, hypertension, and hyperlipidemia who presents for follow-up related to hypertension.  Past Medical History    Past Medical History:  Diagnosis Date   Palpitations    Past Surgical History:  Procedure Laterality Date   HAND SURGERY  2009   patient stated he cut his hand, and part of a bone had to be removed.   SPERMATOCELECTOMY Bilateral 09/24/2018   Procedure: SPERMATOCELECTOMY;  Surgeon: Watt Rush, MD;  Location: [facility];  Service: Urology;  Laterality: Bilateral;   WISDOM TOOTH EXTRACTION      Allergies  No Known Allergies   Labs/Other Studies Reviewed    The following studies were reviewed today:  Cardiac Studies & Procedures   ______________________________________________________________________________________________   STRESS TESTS  EXERCISE TOLERANCE TEST (ETT) 01/01/2024  Interpretation Summary   No ST deviation was noted. Arrhythmias during stress: occasional PACs, frequent PVCs. Arrhythmias during recovery: occasional PACs. The ECG was negative for ischemia, however frequent PVCs present with stress.   A Bruce protocol stress test was performed. Exercise capacity was normal. Patient exercised for 7 min and 44 sec. Maximum HR of 137 bpm. MPHR 85.0%. Peak METS 9.6. The patient experienced no angina during the test. The test was stopped because the patient experienced fatigue. The patient reported no symptoms during the stress test. Normal blood pressure and normal heart rate response noted during stress. Heart rate recovery was normal.   Prior study not available for comparison.   ECHOCARDIOGRAM  ECHOCARDIOGRAM COMPLETE  01/01/2024  Narrative ECHOCARDIOGRAM REPORT    Patient Name:   Todd Rogers Date of Exam: 01/01/2024 Medical Rec #:  [id number]       Height:       74.0 in Accession #:    [id number]      Weight:       211.0 lb Date of Birth:  08-Jun-1961       BSA:          2.223 m Patient Age:    62 years        BP:           138/80 mmHg Patient Gender: M               HR:           72 bpm. Exam Location:  [address]  Procedure: 2D Echo, Cardiac Doppler, Color Doppler, 3D Echo and Strain Analysis  Indications:    R07.9 Chest pain  History:        Patient has no prior history of Echocardiogram examinations. Signs/Symptoms:Chest Pain and Palpitations; Risk Factors:Hypertension and Dyslipidemia.  Sonographer:    Elsie Bohr RDCS Referring Phys: [id number] JAMES HOCHREIN  IMPRESSIONS   1. Left ventricular ejection fraction, by estimation, is 55 to 60%. Left ventricular ejection fraction by 3D volume is 55 %. The left ventricle has normal function. The left ventricle has no regional wall motion abnormalities. There is moderate asymmetric left ventricular hypertrophy of the basal-septal segment. Left ventricular diastolic parameters are consistent with Grade I diastolic dysfunction (impaired relaxation). The average left ventricular global longitudinal strain is -19.0 %. The global longitudinal strain is normal. 2. Right ventricular systolic function is normal. The right  ventricular size is normal. Tricuspid regurgitation signal is inadequate for assessing PA pressure. 3. The mitral valve is grossly normal. Trivial mitral valve regurgitation. No evidence of mitral stenosis. 4. The aortic valve is tricuspid. Aortic valve regurgitation is not visualized. No aortic stenosis is present. 5. The inferior vena cava is normal in size with greater than 50% respiratory variability, suggesting right atrial pressure of 3 mmHg.  FINDINGS Left Ventricle: Left ventricular ejection fraction, by estimation, is 55 to  60%. Left ventricular ejection fraction by 3D volume is 55 %. The left ventricle has normal function. The left ventricle has no regional wall motion abnormalities. The average left ventricular global longitudinal strain is -19.0 %. The global longitudinal strain is normal. The left ventricular internal cavity size was normal in size. There is moderate asymmetric left ventricular hypertrophy of the basal-septal segment. Left ventricular diastolic parameters are consistent with Grade I diastolic dysfunction (impaired relaxation).  Right Ventricle: The right ventricular size is normal. No increase in right ventricular wall thickness. Right ventricular systolic function is normal. Tricuspid regurgitation signal is inadequate for assessing PA pressure.  Left Atrium: Left atrial size was normal in size.  Right Atrium: Right atrial size was normal in size.  Pericardium: There is no evidence of pericardial effusion.  Mitral Valve: The mitral valve is grossly normal. Trivial mitral valve regurgitation. No evidence of mitral valve stenosis.  Tricuspid Valve: The tricuspid valve is grossly normal. Tricuspid valve regurgitation is trivial. No evidence of tricuspid stenosis.  Aortic Valve: The aortic valve is tricuspid. Aortic valve regurgitation is not visualized. No aortic stenosis is present.  Pulmonic Valve: The pulmonic valve was grossly normal. Pulmonic valve regurgitation is trivial. No evidence of pulmonic stenosis.  Aorta: The aortic root and ascending aorta are structurally normal, with no evidence of dilitation.  Venous: The inferior vena cava is normal in size with greater than 50% respiratory variability, suggesting right atrial pressure of 3 mmHg.  IAS/Shunts: The atrial septum is grossly normal.   LEFT VENTRICLE PLAX 2D LVIDd:         4.75 cm         Diastology LVIDs:         2.85 cm         LV e' medial:    5.66 cm/s LV PW:         1.20 cm         LV E/e' medial:  10.5 LV IVS:         1.50 cm         LV e' lateral:   8.38 cm/s LVOT diam:     2.20 cm         LV E/e' lateral: 7.1 LV SV:         75 LV SV Index:   34              2D LVOT Area:     3.80 cm        Longitudinal Strain 2D Strain GLS  -19.0 % Avg:  3D Volume EF LV 3D EF:    Left ventricul ar ejection fraction by 3D volume is 55 %.  3D Volume EF: 3D EF:        55 % LV EDV:       167 ml LV ESV:       76 ml LV SV:        92 ml  RIGHT VENTRICLE  IVC RV S prime:     13.20 cm/s  IVC diam: 1.10 cm TAPSE (M-mode): 1.9 cm  LEFT ATRIUM             Index        RIGHT ATRIUM           Index LA diam:        3.65 cm 1.64 cm/m   RA Pressure: 3.00 mmHg LA Vol (A2C):   43.4 ml 19.52 ml/m  RA Area:     16.70 cm LA Vol (A4C):   37.1 ml 16.69 ml/m  RA Volume:   55.00 ml  24.74 ml/m LA Biplane Vol: 43.1 ml 19.38 ml/m AORTIC VALVE LVOT Vmax:   101.00 cm/s LVOT Vmean:  68.100 cm/s LVOT VTI:    0.197 m  AORTA Ao Root diam: 3.30 cm Ao Asc diam:  3.70 cm  MITRAL VALVE               TRICUSPID VALVE MV Area (PHT): 2.42 cm    Estimated RAP:  3.00 mmHg MV Decel Time: 314 msec MV E velocity: 59.20 cm/s  SHUNTS MV A velocity: 73.40 cm/s  Systemic VTI:  0.20 m MV E/A ratio:  0.81        Systemic Diam: 2.20 cm  Darryle Decent MD Electronically signed by Darryle Decent MD Signature Date/Time: 01/01/2024/1:59:14 PM    Final    MONITORS  LONG TERM MONITOR (3-14 DAYS) 11/22/2023  Narrative NSR with sinus brady (56/min) and sinus tachy (139/min), ave 82/min. PAF is present, longest 46 seconds and less than 1% burden. Frequent PAC's (9.8%) and rare PVC's (<1%). No VT or SVT or prolonged pauses No symptoms recorded Patch Wear Time:  2 days and 13 hours (2024-12-12T09:39:53-0500 to 2024-12-14T22:59:14-0500)   CT SCANS  CT CARDIAC SCORING (SELF PAY ONLY) 03/09/2022  Addendum 03/09/2022 11:50 AM ADDENDUM REPORT: 03/09/2022 11:48  EXAM: OVER-READ INTERPRETATION  CT CHEST  The following  report is an over-read performed by radiologist Dr. Juliene Cramp [facility] on 03/09/2022. This over-read does not include interpretation of cardiac or coronary anatomy or pathology. The coronary calcium  score interpretation by the cardiologist is attached.  COMPARISON:  Chest radiograph 09/27/2022  FINDINGS: Visualized mediastinal structures are normal. Images of the upper abdomen are unremarkable. No airspace disease or consolidation in the visualized lungs. 4 mm nodule in the lingula on sequence 4, image 49. No acute bone abnormality.  IMPRESSION: 1. No acute extracardiac findings. 2. 4 mm nodule in the lingula is indeterminate. No follow-up needed if patient is low-risk. Non-contrast chest CT can be considered in 12 months if patient is high-risk. This recommendation follows the consensus statement: Guidelines for Management of Incidental Pulmonary Nodules Detected on CT Images: From the Fleischner Society 2017; Radiology 2017; 284:228-243.   Electronically Signed By: Juliene Balder M.D. On: 03/09/2022 11:48  Narrative CLINICAL DATA:  Cardiovascular Disease Risk stratification  EXAM: Coronary Calcium  Score  TECHNIQUE: A gated, non-contrast computed tomography scan of the heart was performed using 3mm slice thickness. Axial images were analyzed on a dedicated workstation. Calcium  scoring of the coronary arteries was performed using the Agatston method.  FINDINGS: Coronary arteries: Normal origins.  Coronary Calcium  Score:  Left main: 0  Left anterior descending artery: 09.28  Left circumflex artery: 0  Right coronary artery: 0  Total: 9.28  Percentile: 64th  Pericardium: Normal.  Ascending Aorta: Mildly dilated measuring 40mm at the bifurcation of the main pulmonary artery. Consider dedicated Chest CTA  or MRI/MRA to further assess. Scattered calcifications.  Non-cardiac: See separate report from [facility].  IMPRESSION: Coronary  calcium  score of 9.28. This was 64th percentile for age-, race-, and sex-matched controls.  Mildly dilated measuring 40mm at the bifurcation of the main pulmonary artery. Consider dedicated Chest CTA or MRI/MRA to further assess.  RECOMMENDATIONS: Coronary artery calcium  (CAC) score is a strong predictor of incident coronary heart disease (CHD) and provides predictive information beyond traditional risk factors. CAC scoring is reasonable to use in the decision to withhold, postpone, or initiate statin therapy in intermediate-risk or selected borderline-risk asymptomatic adults (age 55-75 years and LDL-C >=70 to <190 mg/dL) who do not have diabetes or established atherosclerotic cardiovascular disease (ASCVD).* In intermediate-risk (10-year ASCVD risk >=7.5% to <20%) adults or selected borderline-risk (10-year ASCVD risk >=5% to <7.5%) adults in whom a CAC score is measured for the purpose of making a treatment decision the following recommendations have been made:  If CAC=0, it is reasonable to withhold statin therapy and reassess in 5 to 10 years, as long as higher risk conditions are absent (diabetes mellitus, family history of premature CHD in first degree relatives (males <55 years; females <65 years), cigarette smoking, or LDL >=190 mg/dL).  If CAC is 1 to 99, it is reasonable to initiate statin therapy for patients >=49 years of age.  If CAC is >=100 or >=75th percentile, it is reasonable to initiate statin therapy at any age.  Cardiology referral should be considered for patients with CAC scores >=400 or >=75th percentile.  *2018 AHA/ACC/AACVPR/AAPA/ABC/ACPM/ADA/AGS/APhA/ASPC/NLA/PCNA Guideline on the Management of Blood Cholesterol: A Report of the American College of Cardiology/American Heart Association Task Force on Clinical Practice Guidelines. J Am Coll Cardiol. 2019;73(24):3168-3209.  Wilbert Bihari, MD  Electronically Signed: By: Wilbert Bihari M.D. On:  03/09/2022 10:38     ______________________________________________________________________________________________     Recent Labs: 10/29/2023: Hemoglobin 16.1; Magnesium 2.3; Platelets 195.0; TSH 1.38 02/15/2024: BUN 11; Creatinine, Ser 1.02; Potassium 4.5; Sodium 142 04/16/2024: ALT 31  Recent Lipid Panel    Component Value Date/Time   CHOL 136 04/16/2024 0844   TRIG 57 04/16/2024 0844   HDL 57 04/16/2024 0844   CHOLHDL 2.4 04/16/2024 0844   CHOLHDL 3 02/01/2023 0813   VLDL 11.4 02/01/2023 0813   LDLCALC 67 04/16/2024 0844    History of Present Illness    63 year old male with the above past medical history including elevated coronary artery calcium  score,  paroxysmal atrial fibrillation, PACs, PVCs, LVH, hypertension, and hyperlipidemia.    Coronary calcium  score in 02/2022 was 9.28 (64th percentile). 3-day Zio in 10/2023 revealed sinus rhythm, sinus bradycardia, paroxysmal atrial fibrillation, longest episode lasting 46 seconds, less than 1% burden, frequent PVCs, rare PVCs. Anticoagulation was deferred.  He was referred to cardiology.  He was seen in the office in January 2025 and reported daily palpitations.  He noted chest discomfort at night when lying down, associated with certain foods.  Echocardiogram in 12/2023 showed EF 55 to 60%, normal LV function, no RWMA, moderate asymmetric LVH of the basal septal segment, G1 DD, normal RV systolic function, no significant valvular abnormalities.  ETT in 12/2023 was negative for ischemia, he was noted to have occasional PACs, frequent PVCs.  He requested to switch from Dr. Lavona to Dr. Jeffrie or a cardiologist at the [facility].  His follow-up visit March 2025 he reported intermittent palpitations.  He was transitioned from metoprolol  succinate to metoprolol  tartrate.  He was last seen in the office on  03/27/2024 and was stable from a cardiac standpoint.  He noted significant improvement in his palpitations.  BP was elevated.   Amlodipine  5 mg daily was resumed.   He presents today for follow-up.  Since his last visit he has been stable from a cardiac standpoint.  He denies symptoms concerning for angina. He denies any palpitations, dizziness, dyspnea, edema, PND, orthopnea, weight gain.  BP has been well-controlled. Overall, he reports feeling well.    Home Medications    Current Outpatient Medications  Medication Sig Dispense Refill   amLODipine  (NORVASC ) 5 MG tablet Take 1 tablet (5 mg total) by mouth daily. 90 tablet 3   Ascorbic Acid (VITAMIN C) 100 MG tablet Take 100 mg by mouth. 3 - 4 times weekly, mainly during winter months.     azelastine  (ASTELIN ) 0.1 % nasal spray USE 2 SPRAYS IN EACH NOSTRIL TWICE DAILY 30 mL 12   cholecalciferol (VITAMIN D3) 25 MCG (1000 UT) tablet Take 1,000 Units by mouth daily.     fexofenadine  (ALLEGRA  ALLERGY) 180 MG tablet Take 1 tablet (180 mg total) by mouth daily. 90 tablet 3   metoprolol  tartrate (LOPRESSOR ) 50 MG tablet Take 1 tablet (50 mg total) by mouth 2 (two) times daily. 180 tablet 3   Multiple Vitamin (MULTIVITAMIN) tablet Take 1 tablet by mouth daily.     Olopatadine  HCl (PATADAY ) 0.7 % SOLN 1 drop to each eye daily (Patient taking differently: 1 drop to each eye as needed.) 2.5 mL 0   rosuvastatin  (CRESTOR ) 10 MG tablet Take 1 tablet (10 mg total) by mouth daily. 90 tablet 1   zinc gluconate 50 MG tablet Take 50 mg by mouth. 3 wkly.     No current facility-administered medications for this visit.     Review of Systems    He denies chest pain, palpitations, dyspnea, pnd, orthopnea, n, v, dizziness, syncope, edema, weight gain, or early satiety. All other systems reviewed and are otherwise negative except as noted above.   Physical Exam    VS:  BP 122/82   Pulse 91   Ht 6' 2 (1.88 m)   Wt 206 lb 6.4 oz (93.6 kg)   SpO2 97%   BMI 26.50 kg/m   GEN: Well nourished, well developed, in no acute distress. HEENT: normal. Neck: Supple, no JVD, carotid bruits, or  masses. Cardiac: RRR, no murmurs, rubs, or gallops. No clubbing, cyanosis, edema.  Radials/DP/PT 2+ and equal bilaterally.  Respiratory:  Respirations regular and unlabored, clear to auscultation bilaterally. GI: Soft, nontender, nondistended, BS + x 4. MS: no deformity or atrophy. Skin: warm and dry, no rash. Neuro:  Strength and sensation are intact. Psych: Normal affect.  Accessory Clinical Findings    ECG personally reviewed by me today - EKG Interpretation Date/Time:  Friday June 06 2024 08:59:42 EDT Ventricular Rate:  77 PR Interval:  156 QRS Duration:  100 QT Interval:  446 QTC Calculation: 504 R Axis:   -4  Text Interpretation: Normal sinus rhythm Incomplete right bundle branch block Prolonged QT When compared with ECG of 27-Mar-2024 09:33, No significant change was found Confirmed by Daneen Perkins ([id number]) on 06/06/2024 9:05:51 AM  - no acute changes.   Lab Results  Component Value Date   WBC 5.7 10/29/2023   HGB 16.1 10/29/2023   HCT 48.4 10/29/2023   MCV 87.2 10/29/2023   PLT 195.0 10/29/2023   Lab Results  Component Value Date   CREATININE 1.02 02/15/2024   BUN 11 02/15/2024  NA 142 02/15/2024   K 4.5 02/15/2024   CL 105 02/15/2024   CO2 22 02/15/2024   Lab Results  Component Value Date   ALT 31 04/16/2024   AST 26 04/16/2024   ALKPHOS 115 04/16/2024   BILITOT 0.4 04/16/2024   Lab Results  Component Value Date   CHOL 136 04/16/2024   HDL 57 04/16/2024   LDLCALC 67 04/16/2024   TRIG 57 04/16/2024   CHOLHDL 2.4 04/16/2024    Lab Results  Component Value Date   HGBA1C 5.4 02/01/2023    Assessment & Plan    1.  Hypertension: BP well controlled. Continue current antihypertensive regimen.   2. Elevated coronary artery calcium  score: Coronary calcium  score in 02/2022 was 9.28 (64th percentile). ETT in 12/2023 was negative for ischemia. Stable with no anginal symptoms. No indication for ischemic evaluation.  Continue amlodipine , metoprolol , Crestor .    3. LVH: Echocardiogram in 12/2023 showed EF 55 to 60%, normal LV function, no RWMA, moderate asymmetric LVH of the basal septal segment, G1 DD, normal RV systolic function, no significant valvular abnormalities. Euvolemic and well compensated on exam.  Continue metoprolol .   4. Paroxysmal atrial fibrillation/PACs/PVCs: 3-day Zio in 10/2023 revealed sinus rhythm, sinus bradycardia, paroxysmal atrial fibrillation, longest episode lasting 46 seconds, less than 1% burden, frequent PVCs, rare PVCs. Anticoagulation was deferred.  He was noted to have frequent PVCs, occasional PACs during ETT in 12/2023.  He was transitioned from metoprolol  succinate to metoprolol  tartrate with significant improvement in his symptoms.  He denies any recent palpitations.  Continue metoprolol .     5. Hyperlipidemia: LDL was 67 in 03/2024.  Continue Crestor .   6. Disposition: Follow-up in 6 months, sooner if needed.        Damien JAYSON Braver, NP 06/06/2024, 12:07 PM

## 2024-06-06 NOTE — Patient Instructions (Signed)
 Medication Instructions:  Your physician recommends that you continue on your current medications as directed. Please refer to the Current Medication list given to you today.  *If you need a refill on your cardiac medications before your next appointment, please call your pharmacy*  Lab Work: NONE ordered at this time of appointment   Testing/Procedures: NONE ordered at this time of appointment   Follow-Up: At [facility], you and your health needs are our priority.  As part of our continuing mission to provide you with exceptional heart care, our providers are all part of one team.  This team includes your primary Cardiologist (physician) and Advanced Practice Providers or APPs (Physician Assistants and Nurse Practitioners) who all work together to provide you with the care you need, when you need it.  Your next appointment:   6 month(s)  Provider:   Dorothye Gathers, MD    We recommend signing up for the patient portal called MyChart.  Sign up information is provided on this After Visit Summary.  MyChart is used to connect with patients for Virtual Visits (Telemedicine).  Patients are able to view lab/test results, encounter notes, upcoming appointments, etc.  Non-urgent messages can be sent to your provider as well.   To learn more about what you can do with MyChart, go to [email or web address].

## 2024-09-04 DIAGNOSIS — E785 Hyperlipidemia, unspecified: Secondary | ICD-10-CM

## 2024-09-05 ENCOUNTER — Other Ambulatory Visit: Payer: Self-pay | Admitting: Nurse Practitioner

## 2024-09-05 DIAGNOSIS — E785 Hyperlipidemia, unspecified: Secondary | ICD-10-CM

## 2024-09-05 MED ORDER — ROSUVASTATIN CALCIUM 10 MG PO TABS: 10.0000 mg | ORAL_TABLET | Freq: Every day | ORAL | 2 refills | Status: AC

## 2024-11-03 ENCOUNTER — Telehealth: Payer: Self-pay | Admitting: Nurse Practitioner

## 2024-11-03 NOTE — Telephone Encounter (Signed)
 Pt c/o medication issue:  1. Name of Medication:   rosuvastatin  (CRESTOR ) 10 MG tablet    amLODipine  (NORVASC ) 5 MG tablet (Expired)  metoprolol  tartrate (LOPRESSOR ) 50 MG tablet (Expired)   2. How are you currently taking this medication (dosage and times per day)? As prescribed   3. Are you having a reaction (difficulty breathing--STAT)? Headache   4. What is your medication issue? Patient has been having bad headaches and thinks they may be related to his bp medication. Requesting c/b to discuss. Please advise.

## 2024-11-03 NOTE — Telephone Encounter (Signed)
 Spoke with pt, he is getting a daily headache and does not want to have to take tylenol  everyday for the HA. Aware it can take 10 day for the amlodipine  to get out of his system. He was given the okay to hold the crestor  for 2 weeks. It can take 1-2 days for the lopressor  to leave the bosy. He is going to try cutting out a medication 1 at a time to try to figure out what maybe causing the headache. He was encouraged to check his blood pressure at home and he reports he has not been doing that. He will let us  know what he finds out.

## 2024-11-18 ENCOUNTER — Other Ambulatory Visit: Payer: Self-pay | Admitting: Nurse Practitioner

## 2024-12-29 ENCOUNTER — Ambulatory Visit: Admitting: Nurse Practitioner

## 2024-12-31 ENCOUNTER — Ambulatory Visit: Admitting: Nurse Practitioner

## 2024-12-31 VITALS — BP 122/80 | HR 57 | Ht 74.0 in | Wt 223.0 lb

## 2024-12-31 DIAGNOSIS — E785 Hyperlipidemia, unspecified: Secondary | ICD-10-CM

## 2024-12-31 DIAGNOSIS — I517 Cardiomegaly: Secondary | ICD-10-CM

## 2024-12-31 DIAGNOSIS — I493 Ventricular premature depolarization: Secondary | ICD-10-CM | POA: Diagnosis not present

## 2024-12-31 DIAGNOSIS — I480 Paroxysmal atrial fibrillation: Secondary | ICD-10-CM

## 2024-12-31 DIAGNOSIS — I491 Atrial premature depolarization: Secondary | ICD-10-CM

## 2024-12-31 DIAGNOSIS — I10 Essential (primary) hypertension: Secondary | ICD-10-CM

## 2024-12-31 DIAGNOSIS — R931 Abnormal findings on diagnostic imaging of heart and coronary circulation: Secondary | ICD-10-CM | POA: Diagnosis not present

## 2024-12-31 NOTE — Patient Instructions (Signed)
 Medication Instructions:  Your physician recommends that you continue on your current medications as directed. Please refer to the Current Medication list given to you today.  *If you need a refill on your cardiac medications before your next appointment, please call your pharmacy*  Lab Work: NONE ordered at this time of appointment   Testing/Procedures: NONE ordered at this time of appointment   Follow-Up: At [facility] you and your health needs are our priority.  As part of our continuing mission to provide you with exceptional heart care, our providers are all part of one team.  This team includes your primary Cardiologist (physician) and Advanced Practice Providers or APPs (Physician Assistants and Nurse Practitioners) who all work together to provide you with the care you need, when you need it.  Your next appointment:   1 year(s)  Provider:   Oneil Parchment, MD or Damien Braver NP   We recommend signing up for the patient portal called MyChart.  Sign up information is provided on this After Visit Summary.  MyChart is used to connect with patients for Virtual Visits (Telemedicine).  Patients are able to view lab/test results, encounter notes, upcoming appointments, etc.  Non-urgent messages can be sent to your provider as well.   To learn more about what you can do with MyChart, go to [email or web address].   Other Instructions

## 2025-04-09 ENCOUNTER — Encounter: Admitting: Family Medicine
